# Patient Record
Sex: Female | Born: 1965 | Race: White | Hispanic: No | Smoking: Former smoker
Health system: Southern US, Community
[De-identification: ages and names within clinical notes are randomized; demographics above are authoritative.]

## PROBLEM LIST (undated history)

## (undated) DIAGNOSIS — S82891A Other fracture of right lower leg, initial encounter for closed fracture: Secondary | ICD-10-CM

## (undated) DIAGNOSIS — F4310 Post-traumatic stress disorder, unspecified: Secondary | ICD-10-CM

## (undated) DIAGNOSIS — Z972 Presence of dental prosthetic device (complete) (partial): Secondary | ICD-10-CM

## (undated) DIAGNOSIS — R7303 Prediabetes: Secondary | ICD-10-CM

## (undated) DIAGNOSIS — E039 Hypothyroidism, unspecified: Secondary | ICD-10-CM

## (undated) DIAGNOSIS — S0990XA Unspecified injury of head, initial encounter: Secondary | ICD-10-CM

## (undated) DIAGNOSIS — F32A Depression, unspecified: Secondary | ICD-10-CM

## (undated) DIAGNOSIS — R12 Heartburn: Secondary | ICD-10-CM

## (undated) DIAGNOSIS — M51369 Other intervertebral disc degeneration, lumbar region without mention of lumbar back pain or lower extremity pain: Secondary | ICD-10-CM

## (undated) DIAGNOSIS — G629 Polyneuropathy, unspecified: Secondary | ICD-10-CM

## (undated) DIAGNOSIS — M545 Low back pain, unspecified: Secondary | ICD-10-CM

## (undated) DIAGNOSIS — G8929 Other chronic pain: Secondary | ICD-10-CM

## (undated) DIAGNOSIS — Z87442 Personal history of urinary calculi: Secondary | ICD-10-CM

## (undated) DIAGNOSIS — Z9289 Personal history of other medical treatment: Secondary | ICD-10-CM

## (undated) DIAGNOSIS — F419 Anxiety disorder, unspecified: Secondary | ICD-10-CM

## (undated) DIAGNOSIS — M5136 Other intervertebral disc degeneration, lumbar region: Secondary | ICD-10-CM

## (undated) DIAGNOSIS — M1990 Unspecified osteoarthritis, unspecified site: Secondary | ICD-10-CM

## (undated) DIAGNOSIS — R519 Headache, unspecified: Secondary | ICD-10-CM

## (undated) DIAGNOSIS — F329 Major depressive disorder, single episode, unspecified: Secondary | ICD-10-CM

## (undated) HISTORY — PX: TONSILLECTOMY: SUR1361

## (undated) HISTORY — PX: FRACTURE SURGERY: SHX138

## (undated) HISTORY — PX: BILATERAL CARPAL TUNNEL RELEASE: SHX6508

## (undated) HISTORY — PX: MULTIPLE TOOTH EXTRACTIONS: SHX2053

## (undated) HISTORY — PX: DILATION AND CURETTAGE OF UTERUS: SHX78

---

## 1999-06-05 HISTORY — PX: ABDOMINAL HYSTERECTOMY: SHX81

## 2017-05-04 DIAGNOSIS — Z9289 Personal history of other medical treatment: Secondary | ICD-10-CM

## 2017-05-04 HISTORY — DX: Personal history of other medical treatment: Z92.89

## 2018-05-30 ENCOUNTER — Encounter (HOSPITAL_COMMUNITY): Payer: Self-pay

## 2018-05-30 ENCOUNTER — Inpatient Hospital Stay (HOSPITAL_COMMUNITY)
Admission: EM | Admit: 2018-05-30 | Discharge: 2018-06-12 | DRG: 492 | Disposition: A | Discharge status: Home Health | Payer: Medicaid - Out of State | Attending: Student | Admitting: Student

## 2018-05-30 ENCOUNTER — Emergency Department (HOSPITAL_COMMUNITY): Payer: Medicaid - Out of State

## 2018-05-30 DIAGNOSIS — Z79899 Other long term (current) drug therapy: Secondary | ICD-10-CM

## 2018-05-30 DIAGNOSIS — S82202Q Unspecified fracture of shaft of left tibia, subsequent encounter for open fracture type I or II with malunion: Secondary | ICD-10-CM

## 2018-05-30 DIAGNOSIS — S82201A Unspecified fracture of shaft of right tibia, initial encounter for closed fracture: Secondary | ICD-10-CM

## 2018-05-30 DIAGNOSIS — T148XXA Other injury of unspecified body region, initial encounter: Secondary | ICD-10-CM

## 2018-05-30 DIAGNOSIS — M47812 Spondylosis without myelopathy or radiculopathy, cervical region: Secondary | ICD-10-CM | POA: Diagnosis present

## 2018-05-30 DIAGNOSIS — S82251A Displaced comminuted fracture of shaft of right tibia, initial encounter for closed fracture: Secondary | ICD-10-CM | POA: Diagnosis present

## 2018-05-30 DIAGNOSIS — S82871B Displaced pilon fracture of right tibia, initial encounter for open fracture type I or II: Principal | ICD-10-CM | POA: Diagnosis present

## 2018-05-30 DIAGNOSIS — F419 Anxiety disorder, unspecified: Secondary | ICD-10-CM | POA: Diagnosis present

## 2018-05-30 DIAGNOSIS — T1490XA Injury, unspecified, initial encounter: Secondary | ICD-10-CM

## 2018-05-30 DIAGNOSIS — M5030 Other cervical disc degeneration, unspecified cervical region: Secondary | ICD-10-CM | POA: Diagnosis present

## 2018-05-30 DIAGNOSIS — S32031A Stable burst fracture of third lumbar vertebra, initial encounter for closed fracture: Secondary | ICD-10-CM | POA: Diagnosis present

## 2018-05-30 DIAGNOSIS — S2249XA Multiple fractures of ribs, unspecified side, initial encounter for closed fracture: Secondary | ICD-10-CM

## 2018-05-30 DIAGNOSIS — M5136 Other intervertebral disc degeneration, lumbar region: Secondary | ICD-10-CM | POA: Diagnosis present

## 2018-05-30 DIAGNOSIS — S32030A Wedge compression fracture of third lumbar vertebra, initial encounter for closed fracture: Secondary | ICD-10-CM

## 2018-05-30 DIAGNOSIS — D62 Acute posthemorrhagic anemia: Secondary | ICD-10-CM | POA: Diagnosis not present

## 2018-05-30 DIAGNOSIS — F17210 Nicotine dependence, cigarettes, uncomplicated: Secondary | ICD-10-CM | POA: Diagnosis present

## 2018-05-30 DIAGNOSIS — S82253C Displaced comminuted fracture of shaft of unspecified tibia, initial encounter for open fracture type IIIA, IIIB, or IIIC: Secondary | ICD-10-CM | POA: Diagnosis present

## 2018-05-30 DIAGNOSIS — S82202B Unspecified fracture of shaft of left tibia, initial encounter for open fracture type I or II: Secondary | ICD-10-CM

## 2018-05-30 DIAGNOSIS — S82402B Unspecified fracture of shaft of left fibula, initial encounter for open fracture type I or II: Secondary | ICD-10-CM | POA: Diagnosis present

## 2018-05-30 DIAGNOSIS — S82262B Displaced segmental fracture of shaft of left tibia, initial encounter for open fracture type I or II: Secondary | ICD-10-CM | POA: Diagnosis present

## 2018-05-30 DIAGNOSIS — Z9889 Other specified postprocedural states: Secondary | ICD-10-CM

## 2018-05-30 DIAGNOSIS — S82402Q Unspecified fracture of shaft of left fibula, subsequent encounter for open fracture type I or II with malunion: Secondary | ICD-10-CM

## 2018-05-30 DIAGNOSIS — Z419 Encounter for procedure for purposes other than remedying health state, unspecified: Secondary | ICD-10-CM

## 2018-05-30 DIAGNOSIS — S61214A Laceration without foreign body of right ring finger without damage to nail, initial encounter: Secondary | ICD-10-CM | POA: Diagnosis present

## 2018-05-30 DIAGNOSIS — S2243XA Multiple fractures of ribs, bilateral, initial encounter for closed fracture: Secondary | ICD-10-CM

## 2018-05-30 DIAGNOSIS — Y92410 Unspecified street and highway as the place of occurrence of the external cause: Secondary | ICD-10-CM

## 2018-05-30 DIAGNOSIS — F329 Major depressive disorder, single episode, unspecified: Secondary | ICD-10-CM | POA: Diagnosis present

## 2018-05-30 DIAGNOSIS — G8929 Other chronic pain: Secondary | ICD-10-CM | POA: Diagnosis present

## 2018-05-30 DIAGNOSIS — S2220XA Unspecified fracture of sternum, initial encounter for closed fracture: Secondary | ICD-10-CM

## 2018-05-30 DIAGNOSIS — S82871C Displaced pilon fracture of right tibia, initial encounter for open fracture type IIIA, IIIB, or IIIC: Secondary | ICD-10-CM

## 2018-05-30 DIAGNOSIS — S32039A Unspecified fracture of third lumbar vertebra, initial encounter for closed fracture: Secondary | ICD-10-CM

## 2018-05-30 DIAGNOSIS — S82401A Unspecified fracture of shaft of right fibula, initial encounter for closed fracture: Secondary | ICD-10-CM | POA: Diagnosis present

## 2018-05-30 DIAGNOSIS — S82872C Displaced pilon fracture of left tibia, initial encounter for open fracture type IIIA, IIIB, or IIIC: Secondary | ICD-10-CM | POA: Diagnosis present

## 2018-05-30 HISTORY — DX: Personal history of other medical treatment: Z92.89

## 2018-05-30 HISTORY — DX: Personal history of urinary calculi: Z87.442

## 2018-05-30 HISTORY — DX: Major depressive disorder, single episode, unspecified: F32.9

## 2018-05-30 HISTORY — DX: Low back pain, unspecified: M54.50

## 2018-05-30 HISTORY — DX: Other chronic pain: G89.29

## 2018-05-30 HISTORY — DX: Depression, unspecified: F32.A

## 2018-05-30 HISTORY — DX: Anxiety disorder, unspecified: F41.9

## 2018-05-30 HISTORY — DX: Unspecified osteoarthritis, unspecified site: M19.90

## 2018-05-30 HISTORY — DX: Other intervertebral disc degeneration, lumbar region without mention of lumbar back pain or lower extremity pain: M51.369

## 2018-05-30 HISTORY — DX: Other intervertebral disc degeneration, lumbar region: M51.36

## 2018-05-30 HISTORY — DX: Low back pain: M54.5

## 2018-05-30 LAB — PROTIME-INR
INR: 1.07
Prothrombin Time: 13.8 seconds (ref 11.4–15.2)

## 2018-05-30 LAB — I-STAT CHEM 8, ED
BUN: 9 mg/dL (ref 6–20)
Calcium, Ion: 1.17 mmol/L (ref 1.15–1.40)
Chloride: 104 mmol/L (ref 98–111)
Creatinine, Ser: 0.7 mg/dL (ref 0.44–1.00)
Glucose, Bld: 133 mg/dL — ABNORMAL HIGH (ref 70–99)
HCT: 40 % (ref 36.0–46.0)
Hemoglobin: 13.6 g/dL (ref 12.0–15.0)
Potassium: 3.5 mmol/L (ref 3.5–5.1)
Sodium: 137 mmol/L (ref 135–145)
TCO2: 23 mmol/L (ref 22–32)

## 2018-05-30 LAB — CBC
HCT: 39.5 % (ref 36.0–46.0)
Hemoglobin: 12.9 g/dL (ref 12.0–15.0)
MCH: 31.9 pg (ref 26.0–34.0)
MCHC: 32.7 g/dL (ref 30.0–36.0)
MCV: 97.8 fL (ref 80.0–100.0)
PLATELETS: 258 10*3/uL (ref 150–400)
RBC: 4.04 MIL/uL (ref 3.87–5.11)
RDW: 11.9 % (ref 11.5–15.5)
WBC: 17.7 10*3/uL — ABNORMAL HIGH (ref 4.0–10.5)
nRBC: 0 % (ref 0.0–0.2)

## 2018-05-30 LAB — I-STAT CG4 LACTIC ACID, ED: Lactic Acid, Venous: 5.92 mmol/L (ref 0.5–1.9)

## 2018-05-30 LAB — SAMPLE TO BLOOD BANK

## 2018-05-30 MED ORDER — FENTANYL CITRATE (PF) 100 MCG/2ML IJ SOLN
INTRAMUSCULAR | Status: AC
  Filled 2018-05-30: qty 2

## 2018-05-30 MED ORDER — IOHEXOL 300 MG/ML  SOLN
100.0000 mL | Freq: Once | INTRAMUSCULAR | Status: AC | PRN
  Administered 2018-05-30: 100 mL via INTRAVENOUS

## 2018-05-30 MED ORDER — FENTANYL CITRATE (PF) 100 MCG/2ML IJ SOLN
100.0000 ug | Freq: Once | INTRAMUSCULAR | Status: AC
  Administered 2018-05-30: 100 ug via INTRAVENOUS

## 2018-05-30 MED ORDER — FENTANYL CITRATE (PF) 100 MCG/2ML IJ SOLN
100.0000 ug | Freq: Once | INTRAMUSCULAR | Status: AC | PRN
  Administered 2018-05-31: 100 ug via INTRAVENOUS
  Filled 2018-05-30: qty 2

## 2018-05-30 MED ORDER — TETANUS-DIPHTH-ACELL PERTUSSIS 5-2.5-18.5 LF-MCG/0.5 IM SUSP
0.5000 mL | Freq: Once | INTRAMUSCULAR | Status: AC
  Administered 2018-05-30: 0.5 mL via INTRAMUSCULAR

## 2018-05-30 MED ORDER — CEFAZOLIN SODIUM-DEXTROSE 2-4 GM/100ML-% IV SOLN
INTRAVENOUS | Status: AC
  Administered 2018-05-30: 23:00:00
  Filled 2018-05-30: qty 100

## 2018-05-30 MED ORDER — TETANUS-DIPHTH-ACELL PERTUSSIS 5-2.5-18.5 LF-MCG/0.5 IM SUSP
INTRAMUSCULAR | Status: AC
  Filled 2018-05-30: qty 0.5

## 2018-05-30 MED ORDER — CEFAZOLIN SODIUM-DEXTROSE 1-4 GM/50ML-% IV SOLN
1.0000 g | Freq: Once | INTRAVENOUS | Status: AC
  Administered 2018-05-30: 1 g via INTRAVENOUS

## 2018-05-30 NOTE — Progress Notes (Signed)
Patient was actively being treated by medical staff  and not available for chaplain to provide care at the time.  Not aware of any family or anyone to call. Alixandria Friedt S Rhylee Pucillo, Chaplain   05/30/18 2300  Clinical Encounter Type  Visited With Patient not available   
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
## 2018-05-31 ENCOUNTER — Inpatient Hospital Stay (HOSPITAL_COMMUNITY): Payer: Medicaid - Out of State

## 2018-05-31 ENCOUNTER — Emergency Department (HOSPITAL_COMMUNITY): Payer: Medicaid - Out of State | Admitting: Certified Registered"

## 2018-05-31 ENCOUNTER — Encounter (HOSPITAL_COMMUNITY): Admission: EM | Disposition: A | Discharge status: Home Health | Payer: Self-pay | Source: Home / Self Care

## 2018-05-31 ENCOUNTER — Emergency Department (HOSPITAL_COMMUNITY): Payer: Medicaid - Out of State

## 2018-05-31 DIAGNOSIS — Y92410 Unspecified street and highway as the place of occurrence of the external cause: Secondary | ICD-10-CM | POA: Diagnosis not present

## 2018-05-31 DIAGNOSIS — Z79899 Other long term (current) drug therapy: Secondary | ICD-10-CM | POA: Diagnosis not present

## 2018-05-31 DIAGNOSIS — M5030 Other cervical disc degeneration, unspecified cervical region: Secondary | ICD-10-CM | POA: Diagnosis present

## 2018-05-31 DIAGNOSIS — S2220XA Unspecified fracture of sternum, initial encounter for closed fracture: Secondary | ICD-10-CM | POA: Diagnosis not present

## 2018-05-31 DIAGNOSIS — S82872C Displaced pilon fracture of left tibia, initial encounter for open fracture type IIIA, IIIB, or IIIC: Secondary | ICD-10-CM | POA: Diagnosis not present

## 2018-05-31 DIAGNOSIS — F17210 Nicotine dependence, cigarettes, uncomplicated: Secondary | ICD-10-CM | POA: Diagnosis present

## 2018-05-31 DIAGNOSIS — M5136 Other intervertebral disc degeneration, lumbar region: Secondary | ICD-10-CM | POA: Diagnosis present

## 2018-05-31 DIAGNOSIS — F419 Anxiety disorder, unspecified: Secondary | ICD-10-CM | POA: Diagnosis present

## 2018-05-31 DIAGNOSIS — G8929 Other chronic pain: Secondary | ICD-10-CM | POA: Diagnosis present

## 2018-05-31 DIAGNOSIS — S82871B Displaced pilon fracture of right tibia, initial encounter for open fracture type I or II: Secondary | ICD-10-CM | POA: Diagnosis not present

## 2018-05-31 DIAGNOSIS — S82401A Unspecified fracture of shaft of right fibula, initial encounter for closed fracture: Secondary | ICD-10-CM | POA: Diagnosis not present

## 2018-05-31 DIAGNOSIS — D62 Acute posthemorrhagic anemia: Secondary | ICD-10-CM | POA: Diagnosis not present

## 2018-05-31 DIAGNOSIS — S82402B Unspecified fracture of shaft of left fibula, initial encounter for open fracture type I or II: Secondary | ICD-10-CM | POA: Diagnosis not present

## 2018-05-31 DIAGNOSIS — F329 Major depressive disorder, single episode, unspecified: Secondary | ICD-10-CM | POA: Diagnosis not present

## 2018-05-31 DIAGNOSIS — T1490XA Injury, unspecified, initial encounter: Secondary | ICD-10-CM | POA: Diagnosis present

## 2018-05-31 DIAGNOSIS — M47812 Spondylosis without myelopathy or radiculopathy, cervical region: Secondary | ICD-10-CM | POA: Diagnosis present

## 2018-05-31 DIAGNOSIS — S2243XA Multiple fractures of ribs, bilateral, initial encounter for closed fracture: Secondary | ICD-10-CM | POA: Diagnosis not present

## 2018-05-31 DIAGNOSIS — S82262B Displaced segmental fracture of shaft of left tibia, initial encounter for open fracture type I or II: Secondary | ICD-10-CM | POA: Diagnosis not present

## 2018-05-31 DIAGNOSIS — S82253C Displaced comminuted fracture of shaft of unspecified tibia, initial encounter for open fracture type IIIA, IIIB, or IIIC: Secondary | ICD-10-CM | POA: Diagnosis present

## 2018-05-31 DIAGNOSIS — S61214A Laceration without foreign body of right ring finger without damage to nail, initial encounter: Secondary | ICD-10-CM | POA: Diagnosis not present

## 2018-05-31 DIAGNOSIS — S32031A Stable burst fracture of third lumbar vertebra, initial encounter for closed fracture: Secondary | ICD-10-CM | POA: Diagnosis not present

## 2018-05-31 DIAGNOSIS — S82251A Displaced comminuted fracture of shaft of right tibia, initial encounter for closed fracture: Secondary | ICD-10-CM | POA: Diagnosis not present

## 2018-05-31 HISTORY — PX: INCISION AND DRAINAGE OF WOUND: SHX1803

## 2018-05-31 HISTORY — PX: LACERATION REPAIR: SHX5168

## 2018-05-31 HISTORY — PX: I & D EXTREMITY: SHX5045

## 2018-05-31 HISTORY — PX: EXTERNAL FIXATION LEG: SHX1549

## 2018-05-31 LAB — COMPREHENSIVE METABOLIC PANEL
ALBUMIN: 3.7 g/dL (ref 3.5–5.0)
ALT: 81 U/L — ABNORMAL HIGH (ref 0–44)
ALT: 68 U/L — AB (ref 0–44)
AST: 132 U/L — ABNORMAL HIGH (ref 15–41)
AST: 124 U/L — ABNORMAL HIGH (ref 15–41)
Albumin: 3.6 g/dL (ref 3.5–5.0)
Alkaline Phosphatase: 70 U/L (ref 38–126)
Alkaline Phosphatase: 60 U/L (ref 38–126)
Anion gap: 9 (ref 5–15)
Anion gap: 10 (ref 5–15)
BUN: 8 mg/dL (ref 6–20)
BUN: 7 mg/dL (ref 6–20)
CO2: 24 mmol/L (ref 22–32)
CO2: 25 mmol/L (ref 22–32)
CREATININE: 0.76 mg/dL (ref 0.44–1.00)
Calcium: 9.1 mg/dL (ref 8.9–10.3)
Calcium: 8.4 mg/dL — ABNORMAL LOW (ref 8.9–10.3)
Chloride: 104 mmol/L (ref 98–111)
Chloride: 102 mmol/L (ref 98–111)
Creatinine, Ser: 0.91 mg/dL (ref 0.44–1.00)
GFR calc Af Amer: >60 mL/min (ref >60)
GFR calc Af Amer: >60 mL/min (ref >60)
GFR calc non Af Amer: >60 mL/min (ref >60)
Glucose, Bld: 157 mg/dL — ABNORMAL HIGH (ref 70–99)
Glucose, Bld: 178 mg/dL — ABNORMAL HIGH (ref 70–99)
Potassium: 2.8 mmol/L — ABNORMAL LOW (ref 3.5–5.1)
Potassium: 3.1 mmol/L — ABNORMAL LOW (ref 3.5–5.1)
Sodium: 137 mmol/L (ref 135–145)
Sodium: 137 mmol/L (ref 135–145)
Total Bilirubin: 0.5 mg/dL (ref 0.3–1.2)
Total Bilirubin: 0.5 mg/dL (ref 0.3–1.2)
Total Protein: 6.3 g/dL — ABNORMAL LOW (ref 6.5–8.1)
Total Protein: 5.4 g/dL — ABNORMAL LOW (ref 6.5–8.1)

## 2018-05-31 LAB — CBC
HCT: 29.5 % — ABNORMAL LOW (ref 36.0–46.0)
Hemoglobin: 9.7 g/dL — ABNORMAL LOW (ref 12.0–15.0)
MCH: 32 pg (ref 26.0–34.0)
MCHC: 32.9 g/dL (ref 30.0–36.0)
MCV: 97.4 fL (ref 80.0–100.0)
NRBC: 0 % (ref 0.0–0.2)
PLATELETS: 160 10*3/uL (ref 150–400)
RBC: 3.03 MIL/uL — AB (ref 3.87–5.11)
RDW: 11.9 % (ref 11.5–15.5)
WBC: 9.6 10*3/uL (ref 4.0–10.5)

## 2018-05-31 LAB — MAGNESIUM: Magnesium: 1.7 mg/dL (ref 1.7–2.4)

## 2018-05-31 LAB — MRSA PCR SCREENING: MRSA by PCR: NEGATIVE

## 2018-05-31 LAB — ETHANOL: Alcohol, Ethyl (B): <10 mg/dL (ref <10)

## 2018-05-31 LAB — HIV ANTIBODY (ROUTINE TESTING W REFLEX): HIV Screen 4th Generation wRfx: NONREACTIVE

## 2018-05-31 LAB — LACTIC ACID, PLASMA: Lactic Acid, Venous: 1.8 mmol/L (ref 0.5–1.9)

## 2018-05-31 SURGERY — REPAIR, LACERATION, PEDIATRIC
Anesthesia: General | Site: Leg Lower | Laterality: Right

## 2018-05-31 MED ORDER — EPHEDRINE 5 MG/ML INJ
INTRAVENOUS | Status: AC
  Filled 2018-05-31: qty 10

## 2018-05-31 MED ORDER — LACTATED RINGERS IV SOLN
INTRAVENOUS | Status: DC | PRN
  Administered 2018-05-31 (×3): via INTRAVENOUS

## 2018-05-31 MED ORDER — PROMETHAZINE HCL 25 MG/ML IJ SOLN: 6.2500 mg | INTRAMUSCULAR | Status: DC | PRN

## 2018-05-31 MED ORDER — SODIUM CHLORIDE 0.9 % IR SOLN
Status: DC | PRN
  Administered 2018-05-31: 9000 mL

## 2018-05-31 MED ORDER — ONDANSETRON HCL 4 MG/2ML IJ SOLN
INTRAMUSCULAR | Status: DC | PRN
  Administered 2018-05-31: 4 mg via INTRAVENOUS

## 2018-05-31 MED ORDER — POTASSIUM CHLORIDE IN NACL 20-0.9 MEQ/L-% IV SOLN
INTRAVENOUS | Status: DC
  Administered 2018-05-31 – 2018-06-04 (×6): via INTRAVENOUS
  Filled 2018-05-31 (×6): qty 1000

## 2018-05-31 MED ORDER — HYDROMORPHONE HCL 1 MG/ML IJ SOLN
1.0000 mg | INTRAMUSCULAR | Status: DC | PRN
  Administered 2018-05-31 – 2018-06-08 (×50): 1 mg via INTRAVENOUS
  Filled 2018-05-31 (×46): qty 1

## 2018-05-31 MED ORDER — EPHEDRINE SULFATE 50 MG/ML IJ SOLN
INTRAMUSCULAR | Status: DC | PRN
  Administered 2018-05-31: 10 mg via INTRAVENOUS

## 2018-05-31 MED ORDER — SUCCINYLCHOLINE CHLORIDE 200 MG/10ML IV SOSY
PREFILLED_SYRINGE | INTRAVENOUS | Status: AC
  Filled 2018-05-31: qty 10

## 2018-05-31 MED ORDER — LIDOCAINE 2% (20 MG/ML) 5 ML SYRINGE
INTRAMUSCULAR | Status: AC
  Filled 2018-05-31: qty 5

## 2018-05-31 MED ORDER — ROCURONIUM BROMIDE 50 MG/5ML IV SOSY
PREFILLED_SYRINGE | INTRAVENOUS | Status: AC
  Filled 2018-05-31: qty 5

## 2018-05-31 MED ORDER — FENTANYL CITRATE (PF) 250 MCG/5ML IJ SOLN
INTRAMUSCULAR | Status: AC
  Filled 2018-05-31: qty 5

## 2018-05-31 MED ORDER — HYDROMORPHONE HCL 1 MG/ML IJ SOLN: 0.5000 mg | INTRAMUSCULAR | Status: DC | PRN

## 2018-05-31 MED ORDER — ROCURONIUM BROMIDE 50 MG/5ML IV SOSY
PREFILLED_SYRINGE | INTRAVENOUS | Status: DC | PRN
  Administered 2018-05-31: 50 mg via INTRAVENOUS

## 2018-05-31 MED ORDER — ONDANSETRON HCL 4 MG/2ML IJ SOLN
INTRAMUSCULAR | Status: AC
  Filled 2018-05-31: qty 2

## 2018-05-31 MED ORDER — MIDAZOLAM HCL 2 MG/2ML IJ SOLN
INTRAMUSCULAR | Status: AC
  Filled 2018-05-31: qty 2

## 2018-05-31 MED ORDER — HYDROMORPHONE HCL 1 MG/ML IJ SOLN: 0.2500 mg | INTRAMUSCULAR | Status: DC | PRN

## 2018-05-31 MED ORDER — DOCUSATE SODIUM 100 MG PO CAPS
100.0000 mg | ORAL_CAPSULE | Freq: Two times a day (BID) | ORAL | Status: DC
  Administered 2018-05-31 – 2018-06-12 (×21): 100 mg via ORAL
  Filled 2018-05-31 (×21): qty 1

## 2018-05-31 MED ORDER — ONDANSETRON HCL 4 MG/2ML IJ SOLN
4.0000 mg | Freq: Four times a day (QID) | INTRAMUSCULAR | Status: DC | PRN
  Administered 2018-06-03 – 2018-06-04 (×2): 4 mg via INTRAVENOUS
  Filled 2018-05-31 (×2): qty 2

## 2018-05-31 MED ORDER — CEFAZOLIN SODIUM-DEXTROSE 1-4 GM/50ML-% IV SOLN
INTRAVENOUS | Status: DC | PRN
  Administered 2018-05-31: 1 g via INTRAVENOUS

## 2018-05-31 MED ORDER — ALBUMIN HUMAN 5 % IV SOLN
INTRAVENOUS | Status: DC | PRN
  Administered 2018-05-31: 01:00:00 via INTRAVENOUS

## 2018-05-31 MED ORDER — SUGAMMADEX SODIUM 200 MG/2ML IV SOLN
INTRAVENOUS | Status: DC | PRN
  Administered 2018-05-31: 200 mg via INTRAVENOUS

## 2018-05-31 MED ORDER — PHENYLEPHRINE HCL 10 MG/ML IJ SOLN
INTRAMUSCULAR | Status: DC | PRN
  Administered 2018-05-31: 40 ug via INTRAVENOUS
  Administered 2018-05-31 (×2): 80 ug via INTRAVENOUS
  Administered 2018-05-31: 120 ug via INTRAVENOUS
  Administered 2018-05-31 (×2): 80 ug via INTRAVENOUS
  Administered 2018-05-31: 120 ug via INTRAVENOUS

## 2018-05-31 MED ORDER — VARENICLINE TARTRATE 0.5 MG PO TABS
0.5000 mg | ORAL_TABLET | Freq: Every day | ORAL | Status: AC
  Administered 2018-05-31 – 2018-06-01 (×2): 0.5 mg via ORAL
  Filled 2018-05-31 (×3): qty 1

## 2018-05-31 MED ORDER — PHENYLEPHRINE 40 MCG/ML (10ML) SYRINGE FOR IV PUSH (FOR BLOOD PRESSURE SUPPORT)
PREFILLED_SYRINGE | INTRAVENOUS | Status: AC
  Filled 2018-05-31: qty 20

## 2018-05-31 MED ORDER — VARENICLINE TARTRATE 1 MG PO TABS
1.0000 mg | ORAL_TABLET | Freq: Two times a day (BID) | ORAL | Status: DC
  Administered 2018-06-07 – 2018-06-11 (×9): 1 mg via ORAL
  Filled 2018-05-31 (×13): qty 1

## 2018-05-31 MED ORDER — ENOXAPARIN SODIUM 40 MG/0.4ML ~~LOC~~ SOLN
40.0000 mg | SUBCUTANEOUS | Status: DC
  Administered 2018-05-31 – 2018-06-04 (×5): 40 mg via SUBCUTANEOUS
  Filled 2018-05-31 (×5): qty 0.4

## 2018-05-31 MED ORDER — VARENICLINE TARTRATE 0.5 MG PO TABS
0.5000 mg | ORAL_TABLET | Freq: Two times a day (BID) | ORAL | Status: AC
  Administered 2018-06-03 – 2018-06-06 (×7): 0.5 mg via ORAL
  Filled 2018-05-31 (×8): qty 1

## 2018-05-31 MED ORDER — FENTANYL CITRATE (PF) 100 MCG/2ML IJ SOLN
INTRAMUSCULAR | Status: DC | PRN
  Administered 2018-05-31 (×2): 50 ug via INTRAVENOUS

## 2018-05-31 MED ORDER — LIDOCAINE HCL (CARDIAC) PF 100 MG/5ML IV SOSY
PREFILLED_SYRINGE | INTRAVENOUS | Status: DC | PRN
  Administered 2018-05-31: 100 mg via INTRAVENOUS

## 2018-05-31 MED ORDER — HYDROMORPHONE HCL 1 MG/ML IJ SOLN
1.0000 mg | INTRAMUSCULAR | Status: DC | PRN
  Administered 2018-06-03: 1 mg via INTRAVENOUS
  Filled 2018-05-31 (×5): qty 1

## 2018-05-31 MED ORDER — CEFAZOLIN SODIUM-DEXTROSE 1-4 GM/50ML-% IV SOLN
1.0000 g | Freq: Three times a day (TID) | INTRAVENOUS | Status: DC
  Administered 2018-05-31 – 2018-06-01 (×4): 1 g via INTRAVENOUS
  Filled 2018-05-31 (×5): qty 50

## 2018-05-31 MED ORDER — SUCCINYLCHOLINE CHLORIDE 20 MG/ML IJ SOLN
INTRAMUSCULAR | Status: DC | PRN
  Administered 2018-05-31: 100 mg via INTRAVENOUS

## 2018-05-31 MED ORDER — MEPERIDINE HCL 50 MG/ML IJ SOLN: 6.2500 mg | INTRAMUSCULAR | Status: DC | PRN

## 2018-05-31 MED ORDER — ONDANSETRON 4 MG PO TBDP
4.0000 mg | ORAL_TABLET | Freq: Four times a day (QID) | ORAL | Status: DC | PRN
  Administered 2018-06-02 – 2018-06-10 (×4): 4 mg via ORAL
  Filled 2018-05-31 (×4): qty 1

## 2018-05-31 MED ORDER — DEXAMETHASONE SODIUM PHOSPHATE 10 MG/ML IJ SOLN
INTRAMUSCULAR | Status: AC
  Filled 2018-05-31: qty 1

## 2018-05-31 MED ORDER — POTASSIUM CHLORIDE 10 MEQ/100ML IV SOLN: 10.0000 meq | INTRAVENOUS | Status: DC

## 2018-05-31 MED ORDER — DEXAMETHASONE SODIUM PHOSPHATE 10 MG/ML IJ SOLN
INTRAMUSCULAR | Status: DC | PRN
  Administered 2018-05-31: 10 mg via INTRAVENOUS

## 2018-05-31 MED ORDER — PROPOFOL 10 MG/ML IV BOLUS
INTRAVENOUS | Status: DC | PRN
  Administered 2018-05-31: 100 mg via INTRAVENOUS

## 2018-05-31 MED ORDER — PROPOFOL 10 MG/ML IV BOLUS
INTRAVENOUS | Status: AC
  Filled 2018-05-31: qty 20

## 2018-05-31 SURGICAL SUPPLY — 89 items
ALCOHOL 70% 16 OZ (MISCELLANEOUS) ×3 IMPLANT
BANDAGE ACE 4X5 VEL STRL LF (GAUZE/BANDAGES/DRESSINGS) ×3 IMPLANT
BANDAGE ACE 6X5 VEL STRL LF (GAUZE/BANDAGES/DRESSINGS) ×3 IMPLANT
BANDAGE ELASTIC 4 VELCRO ST LF (GAUZE/BANDAGES/DRESSINGS) ×3 IMPLANT
BANDAGE ELASTIC 6 VELCRO ST LF (GAUZE/BANDAGES/DRESSINGS) ×6 IMPLANT
BANDAGE ESMARK 6X9 LF (GAUZE/BANDAGES/DRESSINGS) ×2 IMPLANT
BAR GLASS FIBER EXFX 11X350 (EXFIX) IMPLANT
BAR GLASS FIBER EXFX 11X400 (EXFIX) ×12 IMPLANT
BNDG COHESIVE 4X5 TAN STRL (GAUZE/BANDAGES/DRESSINGS) IMPLANT
BNDG COHESIVE 6X5 TAN STRL LF (GAUZE/BANDAGES/DRESSINGS) ×3 IMPLANT
BNDG ESMARK 6X9 LF (GAUZE/BANDAGES/DRESSINGS) ×3
BNDG GAUZE ELAST 4 BULKY (GAUZE/BANDAGES/DRESSINGS) ×9 IMPLANT
CLAMP BLUE BAR TO PIN (EXFIX) ×12 IMPLANT
CLEANER TIP ELECTROSURG 2X2 (MISCELLANEOUS) ×3 IMPLANT
COVER SURGICAL LIGHT HANDLE (MISCELLANEOUS) ×3 IMPLANT
COVER WAND RF STERILE (DRAPES) ×3 IMPLANT
CUFF TOURNIQUET SINGLE 18IN (TOURNIQUET CUFF) IMPLANT
CUFF TOURNIQUET SINGLE 24IN (TOURNIQUET CUFF) IMPLANT
CUFF TOURNIQUET SINGLE 34IN LL (TOURNIQUET CUFF) IMPLANT
CUFF TOURNIQUET SINGLE 44IN (TOURNIQUET CUFF) IMPLANT
DRAPE C-ARM 42X72 X-RAY (DRAPES) ×3 IMPLANT
DRAPE C-ARMOR (DRAPES) ×6 IMPLANT
DRAPE OEC MINIVIEW 54X84 (DRAPES) ×3 IMPLANT
DRAPE U-SHAPE 47X51 STRL (DRAPES) ×6 IMPLANT
DRSG ADAPTIC 3X8 NADH LF (GAUZE/BANDAGES/DRESSINGS) ×3 IMPLANT
DRSG PAD ABDOMINAL 8X10 ST (GAUZE/BANDAGES/DRESSINGS) IMPLANT
DRSG XEROFORM 1X8 (GAUZE/BANDAGES/DRESSINGS) ×3 IMPLANT
DURAPREP 26ML APPLICATOR (WOUND CARE) IMPLANT
ELECT REM PT RETURN 9FT ADLT (ELECTROSURGICAL) ×3
ELECTRODE REM PT RTRN 9FT ADLT (ELECTROSURGICAL) ×2 IMPLANT
EVACUATOR 1/8 PVC DRAIN (DRAIN) IMPLANT
GAUZE SPONGE 4X4 12PLY STRL (GAUZE/BANDAGES/DRESSINGS) ×6 IMPLANT
GAUZE SPONGE 4X4 12PLY STRL LF (GAUZE/BANDAGES/DRESSINGS) ×9 IMPLANT
GAUZE XEROFORM 5X9 LF (GAUZE/BANDAGES/DRESSINGS) ×3 IMPLANT
GLOVE BIOGEL PI IND STRL 7.5 (GLOVE) ×2 IMPLANT
GLOVE BIOGEL PI IND STRL 8 (GLOVE) ×2 IMPLANT
GLOVE BIOGEL PI INDICATOR 7.5 (GLOVE) ×1
GLOVE BIOGEL PI INDICATOR 8 (GLOVE) ×1
GLOVE ECLIPSE 7.0 STRL STRAW (GLOVE) ×3 IMPLANT
GLOVE SURG ORTHO 8.0 STRL STRW (GLOVE) ×3 IMPLANT
GOWN STRL REUS W/ TWL LRG LVL3 (GOWN DISPOSABLE) ×6 IMPLANT
GOWN STRL REUS W/ TWL XL LVL3 (GOWN DISPOSABLE) ×2 IMPLANT
GOWN STRL REUS W/TWL LRG LVL3 (GOWN DISPOSABLE) ×3
GOWN STRL REUS W/TWL XL LVL3 (GOWN DISPOSABLE) ×1
HALF PIN 5.0X160 (EXFIX) ×12 IMPLANT
HANDPIECE INTERPULSE COAX TIP (DISPOSABLE)
IV NS IRRIG 3000ML ARTHROMATIC (IV SOLUTION) ×9 IMPLANT
KIT BASIN OR (CUSTOM PROCEDURE TRAY) ×3 IMPLANT
KIT TURNOVER KIT B (KITS) ×3 IMPLANT
MANIFOLD NEPTUNE II (INSTRUMENTS) ×3 IMPLANT
NEEDLE 22X1 1/2 (OR ONLY) (NEEDLE) IMPLANT
NS IRRIG 1000ML POUR BTL (IV SOLUTION) ×3 IMPLANT
PACK ORTHO EXTREMITY (CUSTOM PROCEDURE TRAY) ×3 IMPLANT
PAD ARMBOARD 7.5X6 YLW CONV (MISCELLANEOUS) ×6 IMPLANT
PAD CAST 4YDX4 CTTN HI CHSV (CAST SUPPLIES) ×4 IMPLANT
PADDING CAST COTTON 4X4 STRL (CAST SUPPLIES) ×2
PADDING CAST COTTON 6X4 STRL (CAST SUPPLIES) ×6 IMPLANT
PIN CAPS 6MM EXFIX (EXFIX) ×3 IMPLANT
PIN CLAMP 2BAR 75MM BLUE (EXFIX) ×6 IMPLANT
PIN TRANSFIXING 5.0 (EXFIX) ×6 IMPLANT
SET CYSTO W/LG BORE CLAMP LF (SET/KITS/TRAYS/PACK) ×6 IMPLANT
SET HNDPC FAN SPRY TIP SCT (DISPOSABLE) IMPLANT
SPLINT FIBERGLASS 4X30 (CAST SUPPLIES) ×6 IMPLANT
SPONGE LAP 18X18 X RAY DECT (DISPOSABLE) ×6 IMPLANT
SPONGE LAP 4X18 RFD (DISPOSABLE) IMPLANT
STAPLER VISISTAT 35W (STAPLE) IMPLANT
STOCKINETTE IMPERVIOUS 9X36 MD (GAUZE/BANDAGES/DRESSINGS) ×6 IMPLANT
STOCKINETTE IMPERVIOUS LG (DRAPES) ×3 IMPLANT
STRIP CLOSURE SKIN 1/2X4 (GAUZE/BANDAGES/DRESSINGS) IMPLANT
SUCTION FRAZIER HANDLE 10FR (MISCELLANEOUS)
SUCTION TUBE FRAZIER 10FR DISP (MISCELLANEOUS) IMPLANT
SUT ETHILON 2 0 FS 18 (SUTURE) ×6 IMPLANT
SUT ETHILON 3 0 PS 1 (SUTURE) IMPLANT
SUT VIC AB 0 CT1 27 (SUTURE) ×2
SUT VIC AB 0 CT1 27XBRD ANBCTR (SUTURE) ×4 IMPLANT
SUT VIC AB 2-0 CT1 27 (SUTURE) ×2
SUT VIC AB 2-0 CT1 TAPERPNT 27 (SUTURE) ×4 IMPLANT
SUT VIC AB 3-0 SH 27 (SUTURE)
SUT VIC AB 3-0 SH 27X BRD (SUTURE) IMPLANT
SWAB CULTURE ESWAB REG 1ML (MISCELLANEOUS) IMPLANT
SWAB CULTURE LIQ STUART DBL (MISCELLANEOUS) IMPLANT
SYR CONTROL 10ML LL (SYRINGE) IMPLANT
TOWEL OR 17X24 6PK STRL BLUE (TOWEL DISPOSABLE) ×6 IMPLANT
TOWEL OR 17X26 10 PK STRL BLUE (TOWEL DISPOSABLE) ×6 IMPLANT
TRAY FOLEY MTR SLVR 16FR STAT (SET/KITS/TRAYS/PACK) ×3 IMPLANT
TUBE CONNECTING 12X1/4 (SUCTIONS) ×3 IMPLANT
UNDERPAD 30X30 (UNDERPADS AND DIAPERS) ×3 IMPLANT
WATER STERILE IRR 1000ML POUR (IV SOLUTION) ×6 IMPLANT
YANKAUER SUCT BULB TIP NO VENT (SUCTIONS) ×3 IMPLANT

## 2018-05-31 NOTE — Anesthesia Procedure Notes (Signed)
Procedure Name: Intubation Date/Time: 05/31/2018 1:09 AM Performed by: Babs Bertin, CRNA Pre-anesthesia Checklist: Patient identified, Emergency Drugs available, Suction available and Patient being monitored Patient Re-evaluated:Patient Re-evaluated prior to induction Oxygen Delivery Method: Circle System Utilized Preoxygenation: Pre-oxygenation with 100% oxygen Induction Type: IV induction and Rapid sequence Laryngoscope Size: Mac and 3 Grade View: Grade I Tube type: Oral Tube size: 7.0 mm Number of attempts: 1 Airway Equipment and Method: Stylet and Oral airway Placement Confirmation: ETT inserted through vocal cords under direct vision,  positive ETCO2 and breath sounds checked- equal and bilateral Secured at: 21 cm Tube secured with: Tape Dental Injury: Teeth and Oropharynx as per pre-operative assessment
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
## 2018-05-31 NOTE — Transfer of Care (Signed)
Immediate Anesthesia Transfer of Care Note  Patient: Judy Hernandez  Procedure(s) Performed: IRRIGATION AND DEBRIDEMENT EXTREMITY (Bilateral Leg Lower) EXTERNAL FIXATION BOTH LEGS (Bilateral Leg Lower) LACERATION REPAIR RIGHT HAND (Right Hand) IRRIGATION AND DEBRIDEMENT WOUND (Right Hand)  Patient Location: PACU  Anesthesia Type:General  Level of Consciousness: awake, alert  and patient cooperative  Airway & Oxygen Therapy: Patient Spontanous Breathing and Patient connected to face mask oxygen  Post-op Assessment: Report given to RN and Post -op Vital signs reviewed and stable  Post vital signs: Reviewed and stable  Last Vitals:  Vitals Value Taken Time  BP 126/75 05/31/2018  3:44 AM  Temp    Pulse 87 05/31/2018  3:46 AM  Resp 18 05/31/2018  3:46 AM  SpO2 100 % 05/31/2018  3:46 AM  Vitals shown include unvalidated device data.  Last Pain:  Vitals:   05/30/18 2308  TempSrc: Oral  PainSc:          Complications: No apparent anesthesia complications
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
## 2018-05-31 NOTE — Progress Notes (Signed)
Patient was actively being treated by medical staff  and not available for chaplain to provide care at the time.  Not aware of any family or anyone to call. Phebe CollaDonna S Delayla Hoffmaster, Chaplain   05/30/18 2300  Clinical Encounter Type  Visited With Patient not available
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
## 2018-05-31 NOTE — Progress Notes (Signed)
Subjective: Day of Surgery Procedure(s) (LRB): IRRIGATION AND DEBRIDEMENT EXTREMITY (Bilateral) EXTERNAL FIXATION BOTH LEGS (Bilateral) LACERATION REPAIR RIGHT HAND (Right) IRRIGATION AND DEBRIDEMENT WOUND (Right) Patient complains of global pain this morning.  Pain is worse in her back.  She denies any lower extremity numbness or tingling or weakness.  She says the pain medication has been helping but makes her sleepy.  She is denies any trouble breathing.   Objective: Vital signs in last 24 hours: Temp:  [97.2 F (36.2 C)-98.3 F (36.8 C)] 97.8 F (36.6 C) (12/28 0818) Pulse Rate:  [61-90] 63 (12/28 1000) Resp:  [10-20] 13 (12/28 1000) BP: (92-146)/(61-109) 99/70 (12/28 1000) SpO2:  [91 %-100 %] 93 % (12/28 1000) Weight:  [57.6 kg-60.3 kg] 60.3 kg (12/28 0431)  Intake/Output from previous day: 12/27 0701 - 12/28 0700 In: 2870.7 [I.V.:2495; IV Piggyback:375.7] Out: 575 [Urine:475; Blood:100] Intake/Output this shift: Total I/O In: 1020 [P.O.:720; I.V.:300] Out: -   Recent Labs    05/30/18 2254 05/30/18 2328 05/31/18 0501  HGB 12.9 13.6 9.7*   Recent Labs    05/30/18 2254 05/30/18 2328 05/31/18 0501  WBC 17.7*  --  9.6  RBC 4.04  --  3.03*  HCT 39.5 40.0 29.5*  PLT 258  --  160   Patient is awake and alert.  She is in mild distress. Respirations even and unlabored.   She is in a cervical collar.  Orthopedic exam Patient is able to elevate bilateral upper extremities off the bed without difficulty.  She has no deformity or crepitance with palpation of her clavicles, shoulders, elbows, forearms, wrists or hands bilaterally.  She endorses sensation in the median, ulnar, radial nerve distribution in bilateral hands.  Fingers are warm and well-perfused.  She does have a dressing in place to the right hand.  Bilateral lower extremities in delta frame external fixators.  Splints in place as well.  On the left knee she does have some medial knee ecchymosis and swelling.   This area is tender to palpation.  She has pain with attempted ligamentous exam of the left knee.  Right knee does not show any notable swelling or bruising.  Some superficial abrasions.  No tenderness palpation about the knee.  Bilateral feet wrapped in splint material toes that are exposed are warm and well-perfused.  She wiggles toes actively on command.  She endorses sensation about the toes.     Assessment/Plan: Day of Surgery Procedure(s) (LRB): IRRIGATION AND DEBRIDEMENT EXTREMITY (Bilateral) EXTERNAL FIXATION BOTH LEGS (Bilateral) LACERATION REPAIR RIGHT HAND (Right) IRRIGATION AND DEBRIDEMENT WOUND (Right)  Patient is on logroll precautions per neurosurgery recommendations given her spine fractures. From an orthopedic standpoint she is nonweightbearing on bilateral lower extremities. Lovenox for DVT prophylaxis. We will order CT scans of bilateral legs to better categorize intra-articular extension and the nature of her fracture patterns.  We will also order post reduction x-rays today. I had a lengthy discussion with her regarding need for further surgeries and the severity of her injuries.  She voiced understanding. I plan to discuss her case with the orthopedic trauma team and given the severity of her injuries will likely have them assume care and treatment of her orthopedic injuries.    Terance HartChristopher R Emmakate Hypes 05/31/2018, 11:06 AM

## 2018-05-31 NOTE — Anesthesia Preprocedure Evaluation (Signed)
Anesthesia Evaluation  Patient identified by MRN, date of birth, ID band Patient awake    Reviewed: Allergy & Precautions, NPO status , Patient's Chart, lab work & pertinent test results  Airway Mallampati: II  TM Distance: >3 FB Neck ROM: Full    Dental  (+) Edentulous Upper, Dental Advisory Given   Pulmonary Current Smoker,    Pulmonary exam normal breath sounds clear to auscultation       Cardiovascular negative cardio ROS Normal cardiovascular exam Rhythm:Regular Rate:Normal     Neuro/Psych PSYCHIATRIC DISORDERS Depression negative neurological ROS     GI/Hepatic negative GI ROS, Neg liver ROS,   Endo/Other  negative endocrine ROS  Renal/GU negative Renal ROS     Musculoskeletal negative musculoskeletal ROS (+)   Abdominal   Peds  Hematology negative hematology ROS (+)   Anesthesia Other Findings   Reproductive/Obstetrics negative OB ROS                             Anesthesia Physical Anesthesia Plan  ASA: II and emergent  Anesthesia Plan: General   Post-op Pain Management:    Induction: Intravenous  PONV Risk Score and Plan: Ondansetron, Treatment may vary due to age or medical condition and Dexamethasone  Airway Management Planned: Video Laryngoscope Planned and Oral ETT  Additional Equipment: None  Intra-op Plan:   Post-operative Plan: Extubation in OR  Informed Consent: I have reviewed the patients History and Physical, chart, labs and discussed the procedure including the risks, benefits and alternatives for the proposed anesthesia with the patient or authorized representative who has indicated his/her understanding and acceptance.   Dental advisory given  Plan Discussed with: CRNA  Anesthesia Plan Comments:         Anesthesia Quick Evaluation

## 2018-06-01 ENCOUNTER — Encounter (HOSPITAL_COMMUNITY): Payer: Self-pay | Admitting: *Deleted

## 2018-06-01 ENCOUNTER — Inpatient Hospital Stay (HOSPITAL_COMMUNITY): Payer: Medicaid - Out of State

## 2018-06-01 ENCOUNTER — Other Ambulatory Visit: Payer: Self-pay

## 2018-06-01 MED ORDER — GABAPENTIN 300 MG PO CAPS
300.0000 mg | ORAL_CAPSULE | Freq: Three times a day (TID) | ORAL | Status: DC
  Administered 2018-06-01 – 2018-06-12 (×31): 300 mg via ORAL
  Filled 2018-06-01 (×30): qty 1

## 2018-06-01 MED ORDER — LORAZEPAM 1 MG PO TABS
1.0000 mg | ORAL_TABLET | ORAL | Status: DC | PRN
  Administered 2018-06-01 – 2018-06-11 (×20): 1 mg via ORAL
  Filled 2018-06-01 (×21): qty 1

## 2018-06-01 MED ORDER — LORAZEPAM 2 MG/ML IJ SOLN
1.0000 mg | INTRAMUSCULAR | Status: DC | PRN
  Administered 2018-06-06: 1 mg via INTRAMUSCULAR
  Filled 2018-06-01: qty 1

## 2018-06-01 MED ORDER — SODIUM CHLORIDE 0.9 % IV SOLN
2.0000 g | Freq: Two times a day (BID) | INTRAVENOUS | Status: DC
  Administered 2018-06-01 – 2018-06-02 (×3): 2 g via INTRAVENOUS
  Filled 2018-06-01 (×4): qty 20

## 2018-06-01 NOTE — Progress Notes (Signed)
Orthopedic Tech Progress Note Patient Details:  Judy Hernandez [birth year] [id number]  Patient ID: Judy Hernandez, female   DOB: [birth year], 52 y.o.   MRN: [id number]   Saul FordyceJennifer C Morgan Rennert 06/01/2018, 10:21 AMCalled [employer] for TLSO brace.
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
## 2018-06-01 NOTE — Progress Notes (Signed)
Pt reminded multiple times to lay flat and log roll only. Pt continued to move around in bed. Explained to pt importance of laying flat. Managed pain with ordered dilaudid but suggested to RN potential change in pain medication in AM.
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
## 2018-06-01 NOTE — Anesthesia Postprocedure Evaluation (Signed)
Anesthesia Post Note  Patient: Judy Hernandez  Procedure(s) Performed: IRRIGATION AND DEBRIDEMENT EXTREMITY (Bilateral Leg Lower) EXTERNAL FIXATION BOTH LEGS (Bilateral Leg Lower) LACERATION REPAIR RIGHT HAND (Right Hand) IRRIGATION AND DEBRIDEMENT WOUND (Right Hand)     Patient location during evaluation: PACU Anesthesia Type: General Level of consciousness: sedated and patient cooperative Pain management: pain level controlled Vital Signs Assessment: post-procedure vital signs reviewed and stable Respiratory status: spontaneous breathing Cardiovascular status: stable Anesthetic complications: no    Last Vitals:  Vitals:   06/01/18 1600 06/01/18 1700  BP: 119/66 114/73  Pulse: 69 86  Resp: 11 16  Temp: 36.9 C   SpO2: 95% 95%    Last Pain:  Vitals:   06/01/18 1703  TempSrc:   PainSc: 9                  Lewie LoronJohn Jaimes Eckert

## 2018-06-01 NOTE — Progress Notes (Signed)
Patient ID: Judy Hernandez, female   DOB: 1966-04-10, 52 y.o.   MRN: [id number] Patient complains of back pain and leg pain.  Leg pain is related to surgery and does not sound radicular necessarily.  Wiggles toes and moves legs.  This seems stable based on previous notes.  MRI reviewed.  There is an L3 acute fracture with slight retropulsion causing mild canal narrowing but no obvious neural compression.  There is no subluxation.  CT scan also reviewed.  Attempt to mobilize in brace.  Brace ordered.  Logroll in bed.
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
## 2018-06-02 ENCOUNTER — Inpatient Hospital Stay (HOSPITAL_COMMUNITY): Payer: Medicaid - Out of State | Admitting: Certified Registered"

## 2018-06-02 ENCOUNTER — Inpatient Hospital Stay (HOSPITAL_COMMUNITY): Payer: Medicaid - Out of State

## 2018-06-02 ENCOUNTER — Encounter (HOSPITAL_COMMUNITY): Payer: Self-pay | Admitting: Orthopaedic Surgery

## 2018-06-02 ENCOUNTER — Encounter (HOSPITAL_COMMUNITY): Admission: EM | Disposition: A | Discharge status: Home Health | Payer: Self-pay | Source: Home / Self Care

## 2018-06-02 HISTORY — PX: EXTERNAL FIXATION LEG: SHX1549

## 2018-06-02 HISTORY — PX: I & D EXTREMITY: SHX5045

## 2018-06-02 LAB — BASIC METABOLIC PANEL
Anion gap: 7 (ref 5–15)
BUN: <5 mg/dL — ABNORMAL LOW (ref 6–20)
CHLORIDE: 101 mmol/L (ref 98–111)
CO2: 29 mmol/L (ref 22–32)
CREATININE: 0.63 mg/dL (ref 0.44–1.00)
Calcium: 8 mg/dL — ABNORMAL LOW (ref 8.9–10.3)
GFR calc Af Amer: >60 mL/min (ref >60)
GFR calc non Af Amer: >60 mL/min (ref >60)
Glucose, Bld: 116 mg/dL — ABNORMAL HIGH (ref 70–99)
Potassium: 3.3 mmol/L — ABNORMAL LOW (ref 3.5–5.1)
Sodium: 137 mmol/L (ref 135–145)

## 2018-06-02 LAB — CBC
HEMATOCRIT: 23.3 % — AB (ref 36.0–46.0)
Hemoglobin: 7.5 g/dL — ABNORMAL LOW (ref 12.0–15.0)
MCH: 31.8 pg (ref 26.0–34.0)
MCHC: 32.2 g/dL (ref 30.0–36.0)
MCV: 98.7 fL (ref 80.0–100.0)
Platelets: 126 10*3/uL — ABNORMAL LOW (ref 150–400)
RBC: 2.36 MIL/uL — ABNORMAL LOW (ref 3.87–5.11)
RDW: 12.5 % (ref 11.5–15.5)
WBC: 7.6 10*3/uL (ref 4.0–10.5)
nRBC: 0 % (ref 0.0–0.2)

## 2018-06-02 SURGERY — EXTERNAL FIXATION, LOWER EXTREMITY
Anesthesia: General | Site: Leg Lower | Laterality: Bilateral

## 2018-06-02 MED ORDER — TOBRAMYCIN SULFATE 1.2 G IJ SOLR
INTRAMUSCULAR | Status: AC
  Filled 2018-06-02: qty 2.4

## 2018-06-02 MED ORDER — ONDANSETRON HCL 4 MG/2ML IJ SOLN
INTRAMUSCULAR | Status: AC
  Filled 2018-06-02: qty 2

## 2018-06-02 MED ORDER — LACTATED RINGERS IV SOLN: INTRAVENOUS | Status: DC

## 2018-06-02 MED ORDER — MIDAZOLAM HCL 2 MG/2ML IJ SOLN
INTRAMUSCULAR | Status: AC
  Filled 2018-06-02: qty 2

## 2018-06-02 MED ORDER — MIDAZOLAM HCL 5 MG/5ML IJ SOLN
INTRAMUSCULAR | Status: DC | PRN
  Administered 2018-06-02: 2 mg via INTRAVENOUS

## 2018-06-02 MED ORDER — METHOCARBAMOL 500 MG PO TABS
500.0000 mg | ORAL_TABLET | Freq: Three times a day (TID) | ORAL | Status: DC
  Administered 2018-06-02 – 2018-06-08 (×18): 500 mg via ORAL
  Filled 2018-06-02 (×17): qty 1

## 2018-06-02 MED ORDER — SODIUM CHLORIDE 0.9 % IV SOLN
2.0000 g | INTRAVENOUS | Status: AC
  Administered 2018-06-03 – 2018-06-05 (×3): 2 g via INTRAVENOUS
  Filled 2018-06-02 (×3): qty 20

## 2018-06-02 MED ORDER — SODIUM CHLORIDE 0.9 % IV SOLN
INTRAVENOUS | Status: DC
  Administered 2018-06-02 (×2): via INTRAVENOUS

## 2018-06-02 MED ORDER — PROPOFOL 10 MG/ML IV BOLUS
INTRAVENOUS | Status: DC | PRN
  Administered 2018-06-02: 150 mg via INTRAVENOUS

## 2018-06-02 MED ORDER — FENTANYL CITRATE (PF) 250 MCG/5ML IJ SOLN
INTRAMUSCULAR | Status: AC
  Filled 2018-06-02: qty 5

## 2018-06-02 MED ORDER — 0.9 % SODIUM CHLORIDE (POUR BTL) OPTIME
TOPICAL | Status: DC | PRN
  Administered 2018-06-02: 1000 mL

## 2018-06-02 MED ORDER — FENTANYL CITRATE (PF) 100 MCG/2ML IJ SOLN
INTRAMUSCULAR | Status: DC | PRN
  Administered 2018-06-02: 25 ug via INTRAVENOUS
  Administered 2018-06-02 (×3): 50 ug via INTRAVENOUS
  Administered 2018-06-02: 25 ug via INTRAVENOUS
  Administered 2018-06-02: 50 ug via INTRAVENOUS

## 2018-06-02 MED ORDER — DEXAMETHASONE SODIUM PHOSPHATE 10 MG/ML IJ SOLN
INTRAMUSCULAR | Status: DC | PRN
  Administered 2018-06-02: 10 mg via INTRAVENOUS

## 2018-06-02 MED ORDER — SODIUM CHLORIDE 0.9 % IR SOLN
Status: DC | PRN
  Administered 2018-06-02: 3000 mL

## 2018-06-02 MED ORDER — PHENYLEPHRINE 40 MCG/ML (10ML) SYRINGE FOR IV PUSH (FOR BLOOD PRESSURE SUPPORT)
PREFILLED_SYRINGE | INTRAVENOUS | Status: DC | PRN
  Administered 2018-06-02 (×2): 200 ug via INTRAVENOUS

## 2018-06-02 MED ORDER — SODIUM CHLORIDE 0.9 % IV SOLN
INTRAVENOUS | Status: DC | PRN
  Administered 2018-06-02: 50 ug/min via INTRAVENOUS

## 2018-06-02 MED ORDER — PROPOFOL 10 MG/ML IV BOLUS
INTRAVENOUS | Status: AC
  Filled 2018-06-02: qty 20

## 2018-06-02 MED ORDER — ONDANSETRON HCL 4 MG/2ML IJ SOLN
INTRAMUSCULAR | Status: DC | PRN
  Administered 2018-06-02: 4 mg via INTRAVENOUS

## 2018-06-02 MED ORDER — VANCOMYCIN HCL 1000 MG IV SOLR
INTRAVENOUS | Status: AC
  Filled 2018-06-02: qty 2000

## 2018-06-02 MED ORDER — HYDROMORPHONE HCL 1 MG/ML IJ SOLN
INTRAMUSCULAR | Status: AC
  Administered 2018-06-02: 0.5 mg via INTRAVENOUS
  Filled 2018-06-02: qty 1

## 2018-06-02 MED ORDER — ACETAMINOPHEN 500 MG PO TABS
1000.0000 mg | ORAL_TABLET | Freq: Three times a day (TID) | ORAL | Status: DC
  Administered 2018-06-02 – 2018-06-12 (×27): 1000 mg via ORAL
  Filled 2018-06-02 (×27): qty 2

## 2018-06-02 MED ORDER — ONDANSETRON HCL 4 MG/2ML IJ SOLN: 4.0000 mg | Freq: Once | INTRAMUSCULAR | Status: DC | PRN

## 2018-06-02 MED ORDER — TOBRAMYCIN SULFATE 1.2 G IJ SOLR
INTRAMUSCULAR | Status: DC | PRN
  Administered 2018-06-02: 2.4 g via TOPICAL

## 2018-06-02 MED ORDER — HYDROMORPHONE HCL 1 MG/ML IJ SOLN
0.2500 mg | INTRAMUSCULAR | Status: DC | PRN
  Administered 2018-06-02 (×2): 0.5 mg via INTRAVENOUS

## 2018-06-02 MED ORDER — VANCOMYCIN HCL 1000 MG IV SOLR
INTRAVENOUS | Status: DC | PRN
  Administered 2018-06-02: 2 g via TOPICAL

## 2018-06-02 MED ORDER — MEPERIDINE HCL 50 MG/ML IJ SOLN: 6.2500 mg | INTRAMUSCULAR | Status: DC | PRN

## 2018-06-02 MED ORDER — LIDOCAINE 2% (20 MG/ML) 5 ML SYRINGE
INTRAMUSCULAR | Status: DC | PRN
  Administered 2018-06-02: 100 mg via INTRAVENOUS

## 2018-06-02 MED ORDER — DEXAMETHASONE SODIUM PHOSPHATE 10 MG/ML IJ SOLN
INTRAMUSCULAR | Status: AC
  Filled 2018-06-02: qty 1

## 2018-06-02 MED ORDER — EPHEDRINE SULFATE-NACL 50-0.9 MG/10ML-% IV SOSY
PREFILLED_SYRINGE | INTRAVENOUS | Status: DC | PRN
  Administered 2018-06-02 (×2): 10 mg via INTRAVENOUS

## 2018-06-02 SURGICAL SUPPLY — 78 items
BANDAGE ACE 4X5 VEL STRL LF (GAUZE/BANDAGES/DRESSINGS) ×3 IMPLANT
BANDAGE ACE 6X5 VEL STRL LF (GAUZE/BANDAGES/DRESSINGS) ×1 IMPLANT
BAR GLASS FIBER EXFX 11X150 (EXFIX) ×4 IMPLANT
BIT DRILL 2.5X2.75 QC CALB (BIT) ×1 IMPLANT
BIT DRILL 3.5X5.5 QC CALB (BIT) ×1 IMPLANT
BIT DRILL CANN XTRAFIX 2.4 (DRILL) IMPLANT
BLADE SURG 15 STRL LF DISP TIS (BLADE) IMPLANT
BLADE SURG 15 STRL SS (BLADE) ×1
BNDG COHESIVE 4X5 TAN STRL (GAUZE/BANDAGES/DRESSINGS) ×3 IMPLANT
BNDG GAUZE ELAST 4 BULKY (GAUZE/BANDAGES/DRESSINGS) ×4 IMPLANT
BRUSH SCRUB SURG 4.25 DISP (MISCELLANEOUS) ×4 IMPLANT
CANISTER WOUND CARE 500ML ATS (WOUND CARE) ×1 IMPLANT
CHLORAPREP W/TINT 26ML (MISCELLANEOUS) ×2 IMPLANT
CLAMP BLUE BAR TO BAR (EXFIX) ×2 IMPLANT
CLAMP BLUE BAR TO PIN (EXFIX) ×6 IMPLANT
CONNECTOR Y ATS VAC SYSTEM (MISCELLANEOUS) ×1 IMPLANT
COVER MAYO STAND STRL (DRAPES) ×2 IMPLANT
COVER SURGICAL LIGHT HANDLE (MISCELLANEOUS) ×4 IMPLANT
COVER WAND RF STERILE (DRAPES) ×2 IMPLANT
DRAPE C-ARM 42X72 X-RAY (DRAPES) IMPLANT
DRAPE C-ARMOR (DRAPES) ×3 IMPLANT
DRAPE EXTREMITY BILATERAL (DRAPES) ×1 IMPLANT
DRAPE IMP U-DRAPE 54X76 (DRAPES) ×5 IMPLANT
DRAPE INCISE IOBAN 66X45 STRL (DRAPES) ×1 IMPLANT
DRAPE ORTHO SPLIT 77X108 STRL (DRAPES) ×2
DRAPE SURG 17X23 STRL (DRAPES) ×2 IMPLANT
DRAPE SURG ORHT 6 SPLT 77X108 (DRAPES) ×2 IMPLANT
DRAPE U-SHAPE 47X51 STRL (DRAPES) ×2 IMPLANT
DRILL CANN 2.4 (DRILL) ×2
DRSG ADAPTIC 3X8 NADH LF (GAUZE/BANDAGES/DRESSINGS) ×2 IMPLANT
DRSG MEPITEL 4X7.2 (GAUZE/BANDAGES/DRESSINGS) ×1 IMPLANT
DRSG VAC ATS MED SENSATRAC (GAUZE/BANDAGES/DRESSINGS) ×2 IMPLANT
ELECT REM PT RETURN 9FT ADLT (ELECTROSURGICAL) ×2
ELECTRODE REM PT RTRN 9FT ADLT (ELECTROSURGICAL) ×1 IMPLANT
EVACUATOR 1/8 PVC DRAIN (DRAIN) IMPLANT
GAUZE SPONGE 4X4 12PLY STRL (GAUZE/BANDAGES/DRESSINGS) ×2 IMPLANT
GLOVE BIO SURGEON STRL SZ 6.5 (GLOVE) ×6 IMPLANT
GLOVE BIO SURGEON STRL SZ7.5 (GLOVE) ×8 IMPLANT
GLOVE BIOGEL PI IND STRL 6.5 (GLOVE) ×1 IMPLANT
GLOVE BIOGEL PI IND STRL 7.5 (GLOVE) ×1 IMPLANT
GLOVE BIOGEL PI INDICATOR 6.5 (GLOVE) ×1
GLOVE BIOGEL PI INDICATOR 7.5 (GLOVE) ×1
GOWN STRL REUS W/ TWL LRG LVL3 (GOWN DISPOSABLE) ×2 IMPLANT
GOWN STRL REUS W/TWL LRG LVL3 (GOWN DISPOSABLE) ×2
HANDPIECE INTERPULSE COAX TIP (DISPOSABLE) ×1
KIT BASIN OR (CUSTOM PROCEDURE TRAY) ×2 IMPLANT
KIT TURNOVER KIT B (KITS) ×2 IMPLANT
MANIFOLD NEPTUNE II (INSTRUMENTS) ×2 IMPLANT
NEEDLE 22X1 1/2 (OR ONLY) (NEEDLE) IMPLANT
NS IRRIG 1000ML POUR BTL (IV SOLUTION) ×2 IMPLANT
PACK ORTHO EXTREMITY (CUSTOM PROCEDURE TRAY) ×2 IMPLANT
PAD ARMBOARD 7.5X6 YLW CONV (MISCELLANEOUS) ×4 IMPLANT
PAD CAST 4YDX4 CTTN HI CHSV (CAST SUPPLIES) IMPLANT
PADDING CAST COTTON 4X4 STRL (CAST SUPPLIES) ×2
PADDING CAST COTTON 6X4 STRL (CAST SUPPLIES) ×2 IMPLANT
PIN 4X100X20MM EXFIX LG BLUNT (PIN) ×4 IMPLANT
SCREW CORT FT 32X3.5XNONLOCK (Screw) IMPLANT
SCREW CORTICAL 3.5MM  28MM (Screw) ×1 IMPLANT
SCREW CORTICAL 3.5MM  32MM (Screw) ×2 IMPLANT
SCREW CORTICAL 3.5MM 28MM (Screw) IMPLANT
SET HNDPC FAN SPRY TIP SCT (DISPOSABLE) IMPLANT
SPONGE LAP 18X18 RF (DISPOSABLE) ×1 IMPLANT
SPONGE LAP 18X18 X RAY DECT (DISPOSABLE) ×2 IMPLANT
STAPLER VISISTAT 35W (STAPLE) ×2 IMPLANT
STRIP CLOSURE SKIN 1/2X4 (GAUZE/BANDAGES/DRESSINGS) IMPLANT
SUT ETHILON 2 0 FS 18 (SUTURE) ×4 IMPLANT
SUT ETHILON 3 0 FSL (SUTURE) ×1 IMPLANT
SUT ETHILON 3 0 PS 1 (SUTURE) ×7 IMPLANT
SUT MON AB 2-0 CT1 36 (SUTURE) ×3 IMPLANT
SUT PDS AB 0 CT 36 (SUTURE) IMPLANT
SUT PROLENE 0 CT (SUTURE) IMPLANT
SWAB CULTURE ESWAB REG 1ML (MISCELLANEOUS) IMPLANT
TOWEL OR 17X24 6PK STRL BLUE (TOWEL DISPOSABLE) ×4 IMPLANT
TOWEL OR 17X26 10 PK STRL BLUE (TOWEL DISPOSABLE) ×4 IMPLANT
TUBE CONNECTING 12X1/4 (SUCTIONS) ×2 IMPLANT
UNDERPAD 30X30 (UNDERPADS AND DIAPERS) ×2 IMPLANT
WATER STERILE IRR 1000ML POUR (IV SOLUTION) ×1 IMPLANT
YANKAUER SUCT BULB TIP NO VENT (SUCTIONS) ×3 IMPLANT

## 2018-06-02 NOTE — Anesthesia Postprocedure Evaluation (Signed)
Anesthesia Post Note  Patient: Judy Hernandez  Procedure(s) Performed: ADJUSTMENT OF EXTERNAL FIXATION BILATERAL TIBIAS (Bilateral Leg Lower) IRRIGATION AND DEBRIDEMENT BILATERAL TIBIAS (Bilateral Leg Lower)     Patient location during evaluation: PACU Anesthesia Type: General Level of consciousness: awake and alert Pain management: pain level controlled Vital Signs Assessment: post-procedure vital signs reviewed and stable Respiratory status: spontaneous breathing, nonlabored ventilation, respiratory function stable and patient connected to nasal cannula oxygen Cardiovascular status: blood pressure returned to baseline and stable Postop Assessment: no apparent nausea or vomiting Anesthetic complications: no    Last Vitals:  Vitals:   06/02/18 1410 06/02/18 1419  BP:  112/74  Pulse:  89  Resp:  (!) 25  Temp: 36.7 C   SpO2:  100%    Last Pain:  Vitals:   06/02/18 1400  TempSrc:   PainSc: 6                  [name] DAVID
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
## 2018-06-02 NOTE — Progress Notes (Signed)
RN verified the presence of a signed informed consent that matches stated procedure by patient. Verified armband matches patient's stated name and birth date. Verified NPO status and that all jewelry, contact, glasses, dentures, and partials had been removed (if applicable).  

## 2018-06-02 NOTE — Interval H&P Note (Signed)
History and Physical Interval Note:  06/02/2018 10:17 AM  Judy Hernandez  has presented today for surgery, with the diagnosis of Bilateral open tibia fractures  The various methods of treatment have been discussed with the patient and family. After consideration of risks, benefits and other options for treatment, the patient has consented to  Procedure(s): ADJUSTMENT OF EXTERNAL FIXATION BILATERAL TIBIAS (Bilateral) IRRIGATION AND DEBRIDEMENT BILATERAL TIBIAS (Bilateral) as a surgical intervention .  The patient's history has been reviewed, patient examined, no change in status, stable for surgery.  I have reviewed the patient's chart and labs.  Questions were answered to the patient's satisfaction.     Caryn BeeKevin P Veyda Kaufman

## 2018-06-02 NOTE — Transfer of Care (Signed)
Immediate Anesthesia Transfer of Care Note  Patient: Judy Hernandez  Procedure(s) Performed: ADJUSTMENT OF EXTERNAL FIXATION BILATERAL TIBIAS (Bilateral Leg Lower) IRRIGATION AND DEBRIDEMENT BILATERAL TIBIAS (Bilateral Leg Lower)  Patient Location: PACU  Anesthesia Type:General  Level of Consciousness: awake and patient cooperative  Airway & Oxygen Therapy: Patient Spontanous Breathing and Patient connected to face mask oxygen  Post-op Assessment: Report given to RN and Post -op Vital signs reviewed and stable  Post vital signs: Reviewed and stable  Last Vitals:  Vitals Value Taken Time  BP 106/70 06/02/2018  1:30 PM  Temp    Pulse 83 06/02/2018  1:31 PM  Resp 11 06/02/2018  1:31 PM  SpO2 99 % 06/02/2018  1:31 PM  Vitals shown include unvalidated device data.  Last Pain:  Vitals:   06/02/18 0434  TempSrc: Axillary  PainSc:       Patients Stated Pain Goal: 2 (05/31/18 0740)  Complications: No apparent anesthesia complications

## 2018-06-02 NOTE — Anesthesia Procedure Notes (Signed)
Procedure Name: LMA Insertion Date/Time: 06/02/2018 10:52 AM Performed by: Rosiland OzMeyers, Dona Walby, CRNA Pre-anesthesia Checklist: Patient identified, Emergency Drugs available, Suction available, Patient being monitored and Timeout performed Patient Re-evaluated:Patient Re-evaluated prior to induction Oxygen Delivery Method: Circle system utilized Preoxygenation: Pre-oxygenation with 100% oxygen Induction Type: IV induction LMA: LMA inserted LMA Size: 4.0 Number of attempts: 1 Placement Confirmation: positive ETCO2 and breath sounds checked- equal and bilateral Tube secured with: Tape Dental Injury: Teeth and Oropharynx as per pre-operative assessment

## 2018-06-02 NOTE — Progress Notes (Signed)
While repositioning patient noticed leakage from right and left legs at external fixation sites. Greater leakage on the right.  A little more swelling in the right foot than earlier. Call to trauma services that referred to ortho. Call To Dr Eulah PontMurphy with no new orders.

## 2018-06-02 NOTE — Progress Notes (Signed)
Subjective: Patient about to leave for the operating room for orthopedic washout of distal lower extremities.  Continues to complain of back pain.  MRI of lumbar spine reviewed: L3 compression/burst fracture without change, noncompressed L2 fracture demonstrated, bilateral L5 pars interarticularis defect again demonstrated.  TLSO ordered, Aspen type delivered, not yet used.  Objective: Vital signs in last 24 hours: Vitals:   06/01/18 2128 06/01/18 2129 06/02/18 0018 06/02/18 0434  BP: 96/73     Pulse:      Resp: 18     Temp:  99.4 F (37.4 C) 98.7 F (37.1 C) 98.9 F (37.2 C)  TempSrc:  Oral Oral Axillary  SpO2:      Weight:      Height:        Intake/Output from previous day: 12/29 0701 - 12/30 0700 In: 1536.5 [P.O.:932; I.V.:404.7; IV Piggyback:199.9] Out: 1900 [Urine:1900] Intake/Output this shift: No intake/output data recorded.  Physical Exam: Awake and alert, oriented.  Following commands.  Speech fluent.  Strength remains at least 3 to 4-/5 in iliopsoas and quadriceps bilaterally, wiggling toes bilaterally.  CBC Recent Labs    05/30/18 2254 05/30/18 2328 05/31/18 0501  WBC 17.7*  --  9.6  HGB 12.9 13.6 9.7*  HCT 39.5 40.0 29.5*  PLT 258  --  160   BMET Recent Labs    05/31/18 0501 06/02/18 0908  NA 137 137  K 3.1* 3.3*  CL 102 101  CO2 25 29  GLUCOSE 178* 116*  BUN 7 <5*  CREATININE 0.76 0.63  CALCIUM 8.4* 8.0*   ABG    Component Value Date/Time   TCO2 23 05/30/2018 2328    Studies/Results: Dg Tibia/fibula Left  Result Date: 05/31/2018 CLINICAL DATA:  Postop. EXAM: LEFT TIBIA AND FIBULA - 2 VIEW COMPARISON:  05/30/2018 FINDINGS: Status post placement of external fixture. Proximal screw traverses the proximal tibia and the distal component traverses the calcaneus. Comminuted fractures of the tibia and fibula are again identified. IMPRESSION: Postoperative changes as described. Electronically Signed   By: Norva Pavlov M.D.   On: 05/31/2018  14:01   Dg Ankle Complete Right  Result Date: 05/31/2018 CLINICAL DATA:  Postoperative. EXAM: RIGHT ANKLE - COMPLETE 3+ VIEW COMPARISON:  05/31/2018 FINDINGS: Status post placement of external fixator across comminuted fracture of the RIGHT ankle fracture. Distal screw traverses the calcaneus. Again noted are comminuted fractures of the distal tibia and fibula. IMPRESSION: External fixator placement. Electronically Signed   By: Norva Pavlov M.D.   On: 05/31/2018 13:55   Mr Lumbar Spine Wo Contrast  Result Date: 06/01/2018 CLINICAL DATA:  Traumatic lumbar spine fracture EXAM: MRI LUMBAR SPINE WITHOUT CONTRAST TECHNIQUE: Multiplanar, multisequence MR imaging of the lumbar spine was performed. No intravenous contrast was administered. COMPARISON:  CT the abdomen from 2 days ago FINDINGS: Segmentation:  5 lumbar type vertebral bodies Alignment:  Chronic grade 1 anterolisthesis at L5-S1 Vertebrae: Marrow edema in the mildly depressed L3 body from comminuted fracture. There is posterior bulging of bone fragments without static neural compression. No posterior element involvement or ligamentous injury is seen. Nondepressed branching fracture in the L2 body. Chronic bilateral L5 pars defects with anterolisthesis. Conus medullaris and cauda equina: Conus extends to the L1 level. Conus and cauda equina appear normal. Paraspinal and other soft tissues: Negative Disc levels: T12- L1: Unremarkable. L1-L2: Unremarkable. L2-L3: Mild posttraumatic disc distortion.  No impingement L3-L4: Minor disc bulging.  No impingement L4-L5: Disc narrowing and bulging.  No impingement L5-S1:The disc is narrowed  and bulging. There is noncompressive bilateral foraminal stenosis. Widely patent spinal canal IMPRESSION: 1. Comminuted L3 body fracture with mild height loss and noncompressive retropulsion. 2. Nondepressed L2 body fracture. 3. Negative for ligamentous or posterior element injury. 4. L5 chronic bilateral pars defects with  L5-S1 anterolisthesis and noncompressive foraminal narrowing. Electronically Signed   By: Marnee SpringJonathon  Watts M.D.   On: 06/01/2018 06:38   Ct Ankle Right Wo Contrast  Result Date: 05/31/2018 CLINICAL DATA:  Comminuted fractures of the distal right tibia and fibula. EXAM: CT OF THE RIGHT ANKLE WITHOUT CONTRAST TECHNIQUE: Multidetector CT imaging of the right ankle was performed according to the standard protocol. Multiplanar CT image reconstructions were also generated. COMPARISON:  Radiographs dated 05/31/2018 FINDINGS: Bones/Joint/Cartilage There is a severely comminuted fracture of distal right tibia with multiple displaced and rotated angulated fragments. The fracture involves the articular surface of the distal tibia with oblique coronal and sagittal fractures through the articular surface. The anterior margin of the articular surface of the distal tibia is severely comminuted. An oblique fracture extends through the base of the medial malleolus. There is also a comminuted slightly displaced fracture of the distal fibula with rotated and displaced fragments. The distal tibiofibular syndesmosis does not appear to be disrupted. There are multiple small bone fragments as well as gas in the ankle joint. The talus and calcaneus and bones of the midfoot are intact. There is a small amount of gas in the subtalar joint. There is extensive air in the soft tissues of distal right lower leg and around the ankle. The tendons around the ankle appear intact. There are some bone fragments adjacent to the posterior tibialis tendon at the level of the medial malleolus. A tibial bone fragment extends into the flexor digitorum longus muscle best seen on image 33 of series 5. Ligaments Suboptimally assessed by CT. Not well seen. IMPRESSION: 1. Severely comminuted fracture of the distal tibia as described. 2. Comminuted fracture of the distal fibula. 3. Multiple small bone fragments and gas in the ankle joint. 4. Multiple bone  fragments adjacent to the posterior tibialis tendon at the level of the medial malleolus. Electronically Signed   By: Francene BoyersJames  Maxwell M.D.   On: 05/31/2018 15:40   Ct Tibia Fibula Left Wo Contrast  Result Date: 05/31/2018 CLINICAL DATA:  Comminuted fractures of distal left tibia and of the proximal and distal aspects of the left fibula. EXAM: CT OF THE LOWER LEFT EXTREMITY WITHOUT CONTRAST TECHNIQUE: Multidetector CT imaging of the lower left extremity was performed according to the standard protocol. COMPARISON:  Radiographs dated 05/31/2018 FINDINGS: Bones/Joint/Cartilage There is a comminuted fracture of the distal 14 cm of the left tibia angulation and displacement and rotation of multiple fragments. The fracture extends into the articular surface of the distal tibia, best seen on image 234 of series 3. There is no depression of the articular surface components. There are multiple small avulsions from the anterior aspect of the lateral malleolus. There is a slightly displaced comminuted fracture of the distal fibular shaft as well as a comminuted minimally displaced fracture of the proximal fibular shaft and fibular head. There is gas in the soft tissues and within the distal tibia at the site of the tibia fracture. There is a small amount of gas in the ankle joint. There is also gas in the soft tissues of the proximal portion of the left lower leg and in the superficial soft tissues of the medial and lateral aspects of the knee. There is  a minimal knee effusion. Fixation hardware in place in proximal tibia. IMPRESSION: 1. Comminuted fracture of the distal 14 cm of the left tibia extending into the articular surface of the distal tibia. 2. Comminuted fractures of the distal fibular shaft and of the proximal fibular shaft. 3. Multiple small avulsions from the anterior aspect of the lateral malleolus. Electronically Signed   By: Francene BoyersJames  Maxwell M.D.   On: 05/31/2018 15:48   Dg Foot Complete Left  Result  Date: 05/31/2018 CLINICAL DATA:  Postoperative. EXAM: LEFT FOOT - COMPLETE 3+ VIEW COMPARISON:  05/30/2018, 05/31/2018 FINDINGS: Status post placement of external fixator with screw traversing the calcaneus. No acute fracture of the LEFT foot. Partially imaged comminuted fractures of the distal tibia and fibula. IMPRESSION: Status post placement of external fixer. Electronically Signed   By: Norva PavlovElizabeth  Brown M.D.   On: 05/31/2018 13:58   Dg Foot Complete Right  Result Date: 05/31/2018 CLINICAL DATA:  Postop. EXAM: RIGHT FOOT COMPLETE - 3+ VIEW COMPARISON:  05/30/2018 FINDINGS: Status post placement of external fixture across the calcaneus. No acute fracture of the RIGHT foot. Partially imaged comminuted fractures of the distal tibia and fibula. IMPRESSION: Status post placement of external fixator. Electronically Signed   By: Norva PavlovElizabeth  Brown M.D.   On: 05/31/2018 14:00   Dg Knee 3 Views Left  Result Date: 05/31/2018 CLINICAL DATA:  Postop. EXAM: LEFT KNEE - 3 VIEW COMPARISON:  05/30/2018 FINDINGS: Status post placement of external fixture. The proximal screw traverses the proximal tibia. Comminuted fractures of the fibula and tibia are again identified. IMPRESSION: Status post placement of external fixator. Electronically Signed   By: Norva PavlovElizabeth  Brown M.D.   On: 05/31/2018 14:01    Assessment/Plan: Neurologically without change.  Have asked nursing staff to have TLSO change from [place] to [place], so as to provide better support when we begin to mobilize her.  Discussed with she and her family that our initial approach will be nonsurgical, with bracing and progressive mobilization.  If unsuccessful, she may require surgical stabilization.  Hewitt ShortsNUDELMAN,ROBERT W, MD 06/02/2018, 9:55 AM

## 2018-06-02 NOTE — Anesthesia Preprocedure Evaluation (Signed)
Anesthesia Evaluation  Patient identified by MRN, date of birth, ID band Patient awake    Reviewed: Allergy & Precautions, NPO status , Patient's Chart, lab work & pertinent test results  Airway Mallampati: I  TM Distance: >3 FB Neck ROM: Full    Dental   Pulmonary former smoker,    Pulmonary exam normal        Cardiovascular Normal cardiovascular exam     Neuro/Psych Anxiety Depression    GI/Hepatic   Endo/Other    Renal/GU      Musculoskeletal   Abdominal   Peds  Hematology   Anesthesia Other Findings   Reproductive/Obstetrics                             Anesthesia Physical Anesthesia Plan  ASA: II  Anesthesia Plan: General   Post-op Pain Management:    Induction: Intravenous  PONV Risk Score and Plan: 3 and Ondansetron, Midazolam and Treatment may vary due to age or medical condition  Airway Management Planned: LMA  Additional Equipment:   Intra-op Plan:   Post-operative Plan: Extubation in OR  Informed Consent: I have reviewed the patients History and Physical, chart, labs and discussed the procedure including the risks, benefits and alternatives for the proposed anesthesia with the patient or authorized representative who has indicated his/her understanding and acceptance.     Plan Discussed with: CRNA and Surgeon  Anesthesia Plan Comments:         Anesthesia Quick Evaluation

## 2018-06-03 ENCOUNTER — Encounter (HOSPITAL_COMMUNITY): Payer: Self-pay | Admitting: Student

## 2018-06-03 LAB — CBC
HCT: 21.2 % — ABNORMAL LOW (ref 36.0–46.0)
Hemoglobin: 6.9 g/dL — CL (ref 12.0–15.0)
MCH: 32.2 pg (ref 26.0–34.0)
MCHC: 32.5 g/dL (ref 30.0–36.0)
MCV: 99.1 fL (ref 80.0–100.0)
Platelets: 128 10*3/uL — ABNORMAL LOW (ref 150–400)
RBC: 2.14 MIL/uL — ABNORMAL LOW (ref 3.87–5.11)
RDW: 12.3 % (ref 11.5–15.5)
WBC: 6.5 10*3/uL (ref 4.0–10.5)
nRBC: 0 % (ref 0.0–0.2)

## 2018-06-03 LAB — ABO/RH: ABO/RH(D): O POS

## 2018-06-03 LAB — BASIC METABOLIC PANEL
Anion gap: 8 (ref 5–15)
BUN: <5 mg/dL — ABNORMAL LOW (ref 6–20)
CO2: 29 mmol/L (ref 22–32)
Calcium: 8.2 mg/dL — ABNORMAL LOW (ref 8.9–10.3)
Chloride: 104 mmol/L (ref 98–111)
Creatinine, Ser: 0.59 mg/dL (ref 0.44–1.00)
GFR calc Af Amer: >60 mL/min (ref >60)
GFR calc non Af Amer: >60 mL/min (ref >60)
Glucose, Bld: 133 mg/dL — ABNORMAL HIGH (ref 70–99)
Potassium: 3.5 mmol/L (ref 3.5–5.1)
SODIUM: 141 mmol/L (ref 135–145)

## 2018-06-03 LAB — PREPARE RBC (CROSSMATCH)

## 2018-06-03 MED ORDER — SODIUM CHLORIDE 0.9% IV SOLUTION
Freq: Once | INTRAVENOUS | Status: AC
  Administered 2018-06-05: 23:00:00 via INTRAVENOUS

## 2018-06-03 MED ORDER — OXYCODONE HCL 5 MG PO TABS
5.0000 mg | ORAL_TABLET | ORAL | Status: DC | PRN
  Administered 2018-06-03 – 2018-06-06 (×13): 5 mg via ORAL
  Filled 2018-06-03 (×11): qty 1

## 2018-06-03 MED ORDER — OXYCODONE-ACETAMINOPHEN 5-325 MG PO TABS
1.0000 | ORAL_TABLET | ORAL | Status: DC | PRN
  Administered 2018-06-04 – 2018-06-08 (×8): 1 via ORAL
  Filled 2018-06-03 (×6): qty 1
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
## 2018-06-03 NOTE — Progress Notes (Signed)
Vitals:   06/02/18 1419 06/02/18 2000 06/02/18 2300 06/03/18 0300  BP: 112/74 110/70 100/63 114/76  Pulse: 89 74 87   Resp: (!) 25 13 18    Temp:  98.8 F (37.1 C) 98.5 F (36.9 C) 97.8 F (36.6 C)  TempSrc:  Oral Oral Oral  SpO2: 100% 99% 94% 97%  Weight:      Height:        CBC Recent Labs    06/02/18 0908 06/03/18 0136  WBC 7.6 6.5  HGB 7.5* 6.9*  HCT 23.3* 21.2*  PLT 126* 128*   BMET Recent Labs    06/02/18 0908 06/03/18 0136  NA 137 141  K 3.3* 3.5  CL 101 104  CO2 29 29  GLUCOSE 116* 133*  BUN <5* <5*  CREATININE 0.63 0.59  CALCIUM 8.0* 8.2*    Complaining of pain from her fractured sternum, her back, and her bilateral tib-fib fractures.  She describes that laying flat on her back aggravates her back pain, on the other hand logrolling also tends to aggravate her back pain.  Vertalign TLSO at bedside.  Plan: Patient can be mobilized in TLSO.  It needs to be donned and doffed in bed.  It needs to be on if she is sitting up and mobilized.  Okay to initiate PT and OT from neurosurgical perspective, if she can be mobilized from perspective of her other injuries.  Hewitt ShortsNUDELMAN,ROBERT W, MD 06/03/2018, 6:26 AM

## 2018-06-04 LAB — CBC
HCT: 24.6 % — ABNORMAL LOW (ref 36.0–46.0)
Hemoglobin: 8 g/dL — ABNORMAL LOW (ref 12.0–15.0)
MCH: 31.9 pg (ref 26.0–34.0)
MCHC: 32.5 g/dL (ref 30.0–36.0)
MCV: 98 fL (ref 80.0–100.0)
Platelets: 170 10*3/uL (ref 150–400)
RBC: 2.51 MIL/uL — ABNORMAL LOW (ref 3.87–5.11)
RDW: 13.3 % (ref 11.5–15.5)
WBC: 7.9 10*3/uL (ref 4.0–10.5)
nRBC: 0 % (ref 0.0–0.2)

## 2018-06-04 LAB — BASIC METABOLIC PANEL
Anion gap: 5 (ref 5–15)
BUN: <5 mg/dL — ABNORMAL LOW (ref 6–20)
CALCIUM: 8.1 mg/dL — AB (ref 8.9–10.3)
CO2: 30 mmol/L (ref 22–32)
Chloride: 107 mmol/L (ref 98–111)
Creatinine, Ser: 0.55 mg/dL (ref 0.44–1.00)
GFR calc Af Amer: >60 mL/min (ref >60)
GFR calc non Af Amer: >60 mL/min (ref >60)
Glucose, Bld: 116 mg/dL — ABNORMAL HIGH (ref 70–99)
Potassium: 3.2 mmol/L — ABNORMAL LOW (ref 3.5–5.1)
Sodium: 142 mmol/L (ref 135–145)

## 2018-06-04 LAB — BPAM RBC
Blood Product Expiration Date: 202001292359
ISSUE DATE / TIME: 201912310725
Unit Type and Rh: 5100

## 2018-06-04 LAB — TYPE AND SCREEN
ABO/RH(D): O POS
Antibody Screen: NEGATIVE
Unit division: 0

## 2018-06-04 MED ORDER — POTASSIUM CHLORIDE CRYS ER 20 MEQ PO TBCR
40.0000 meq | EXTENDED_RELEASE_TABLET | Freq: Two times a day (BID) | ORAL | Status: DC
  Administered 2018-06-04 – 2018-06-09 (×9): 40 meq via ORAL
  Filled 2018-06-04 (×9): qty 2

## 2018-06-04 MED ORDER — ZOLPIDEM TARTRATE 5 MG PO TABS
5.0000 mg | ORAL_TABLET | Freq: Every evening | ORAL | Status: DC | PRN
  Administered 2018-06-05: 5 mg via ORAL
  Filled 2018-06-04: qty 1
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
## 2018-06-05 ENCOUNTER — Inpatient Hospital Stay (HOSPITAL_COMMUNITY): Payer: Medicaid - Out of State

## 2018-06-05 ENCOUNTER — Encounter (HOSPITAL_COMMUNITY): Admission: EM | Disposition: A | Discharge status: Home Health | Payer: Self-pay | Source: Home / Self Care

## 2018-06-05 ENCOUNTER — Inpatient Hospital Stay (HOSPITAL_COMMUNITY): Payer: Medicaid - Out of State | Admitting: Certified Registered Nurse Anesthetist

## 2018-06-05 ENCOUNTER — Encounter (HOSPITAL_COMMUNITY): Payer: Self-pay

## 2018-06-05 HISTORY — PX: OPEN REDUCTION INTERNAL FIXATION (ORIF) TIBIA/FIBULA FRACTURE: SHX5992

## 2018-06-05 LAB — CBC
HCT: 29.9 % — ABNORMAL LOW (ref 36.0–46.0)
Hemoglobin: 9.4 g/dL — ABNORMAL LOW (ref 12.0–15.0)
MCH: 31 pg (ref 26.0–34.0)
MCHC: 31.4 g/dL (ref 30.0–36.0)
MCV: 98.7 fL (ref 80.0–100.0)
Platelets: 213 10*3/uL (ref 150–400)
RBC: 3.03 MIL/uL — AB (ref 3.87–5.11)
RDW: 13.2 % (ref 11.5–15.5)
WBC: 8.4 10*3/uL (ref 4.0–10.5)
nRBC: 0.2 % (ref 0.0–0.2)

## 2018-06-05 LAB — BASIC METABOLIC PANEL
Anion gap: 8 (ref 5–15)
BUN: 7 mg/dL (ref 6–20)
CO2: 28 mmol/L (ref 22–32)
Calcium: 8.5 mg/dL — ABNORMAL LOW (ref 8.9–10.3)
Chloride: 104 mmol/L (ref 98–111)
Creatinine, Ser: 0.57 mg/dL (ref 0.44–1.00)
GFR calc Af Amer: >60 mL/min (ref >60)
GFR calc non Af Amer: >60 mL/min (ref >60)
Glucose, Bld: 104 mg/dL — ABNORMAL HIGH (ref 70–99)
Potassium: 4.4 mmol/L (ref 3.5–5.1)
Sodium: 140 mmol/L (ref 135–145)

## 2018-06-05 SURGERY — OPEN REDUCTION INTERNAL FIXATION (ORIF) TIBIA/FIBULA FRACTURE
Anesthesia: General | Site: Leg Lower | Laterality: Left

## 2018-06-05 MED ORDER — MIDAZOLAM HCL 2 MG/2ML IJ SOLN
INTRAMUSCULAR | Status: AC
  Administered 2018-06-05: 2 mg via INTRAVENOUS
  Filled 2018-06-05: qty 2

## 2018-06-05 MED ORDER — FENTANYL CITRATE (PF) 100 MCG/2ML IJ SOLN: 25.0000 ug | INTRAMUSCULAR | Status: DC | PRN

## 2018-06-05 MED ORDER — FENTANYL CITRATE (PF) 100 MCG/2ML IJ SOLN
INTRAMUSCULAR | Status: AC
  Administered 2018-06-05: 100 ug via INTRAVENOUS
  Filled 2018-06-05: qty 2

## 2018-06-05 MED ORDER — PROMETHAZINE HCL 25 MG/ML IJ SOLN: 6.2500 mg | INTRAMUSCULAR | Status: DC | PRN

## 2018-06-05 MED ORDER — MIDAZOLAM HCL 2 MG/2ML IJ SOLN
INTRAMUSCULAR | Status: AC
  Filled 2018-06-05: qty 2

## 2018-06-05 MED ORDER — VANCOMYCIN HCL 1000 MG IV SOLR
INTRAVENOUS | Status: AC
  Filled 2018-06-05: qty 1000

## 2018-06-05 MED ORDER — ROCURONIUM BROMIDE 50 MG/5ML IV SOSY
PREFILLED_SYRINGE | INTRAVENOUS | Status: AC
  Filled 2018-06-05: qty 10

## 2018-06-05 MED ORDER — GLYCOPYRROLATE 0.2 MG/ML IJ SOLN
INTRAMUSCULAR | Status: DC | PRN
  Administered 2018-06-05 (×2): .1 mg via INTRAVENOUS

## 2018-06-05 MED ORDER — FENTANYL CITRATE (PF) 250 MCG/5ML IJ SOLN
INTRAMUSCULAR | Status: DC | PRN
  Administered 2018-06-05: 50 ug via INTRAVENOUS

## 2018-06-05 MED ORDER — 0.9 % SODIUM CHLORIDE (POUR BTL) OPTIME
TOPICAL | Status: DC | PRN
  Administered 2018-06-05: 1000 mL

## 2018-06-05 MED ORDER — CEFAZOLIN SODIUM-DEXTROSE 2-3 GM-%(50ML) IV SOLR
INTRAVENOUS | Status: DC | PRN
  Administered 2018-06-05: 2 g via INTRAVENOUS

## 2018-06-05 MED ORDER — LACTATED RINGERS IV SOLN
INTRAVENOUS | Status: DC
  Administered 2018-06-05 – 2018-06-06 (×2): via INTRAVENOUS

## 2018-06-05 MED ORDER — DEXAMETHASONE SODIUM PHOSPHATE 10 MG/ML IJ SOLN
INTRAMUSCULAR | Status: AC
  Filled 2018-06-05: qty 3

## 2018-06-05 MED ORDER — LIDOCAINE HCL (CARDIAC) PF 100 MG/5ML IV SOSY
PREFILLED_SYRINGE | INTRAVENOUS | Status: DC | PRN
  Administered 2018-06-05: 20 mg via INTRAVENOUS

## 2018-06-05 MED ORDER — MIDAZOLAM HCL 2 MG/2ML IJ SOLN
2.0000 mg | Freq: Once | INTRAMUSCULAR | Status: AC
  Administered 2018-06-05: 2 mg via INTRAVENOUS

## 2018-06-05 MED ORDER — SODIUM CHLORIDE 0.9 % IV SOLN
INTRAVENOUS | Status: DC | PRN
  Administered 2018-06-05: 30 ug/min via INTRAVENOUS

## 2018-06-05 MED ORDER — SUCCINYLCHOLINE CHLORIDE 200 MG/10ML IV SOSY
PREFILLED_SYRINGE | INTRAVENOUS | Status: AC
  Filled 2018-06-05: qty 10

## 2018-06-05 MED ORDER — BUPIVACAINE-EPINEPHRINE (PF) 0.5% -1:200000 IJ SOLN
INTRAMUSCULAR | Status: DC | PRN
  Administered 2018-06-05: 25 mL via PERINEURAL

## 2018-06-05 MED ORDER — PHENYLEPHRINE 40 MCG/ML (10ML) SYRINGE FOR IV PUSH (FOR BLOOD PRESSURE SUPPORT)
PREFILLED_SYRINGE | INTRAVENOUS | Status: AC
  Filled 2018-06-05: qty 20

## 2018-06-05 MED ORDER — CLONIDINE HCL (ANALGESIA) 100 MCG/ML EP SOLN
EPIDURAL | Status: DC | PRN
  Administered 2018-06-05 (×2): 50 ug

## 2018-06-05 MED ORDER — PROPOFOL 10 MG/ML IV BOLUS
INTRAVENOUS | Status: DC | PRN
  Administered 2018-06-05: 200 mg via INTRAVENOUS

## 2018-06-05 MED ORDER — EPHEDRINE SULFATE-NACL 50-0.9 MG/10ML-% IV SOSY
PREFILLED_SYRINGE | INTRAVENOUS | Status: DC | PRN
  Administered 2018-06-05: 5 mg via INTRAVENOUS
  Administered 2018-06-05: 10 mg via INTRAVENOUS

## 2018-06-05 MED ORDER — DEXAMETHASONE SODIUM PHOSPHATE 10 MG/ML IJ SOLN
INTRAMUSCULAR | Status: DC | PRN
  Administered 2018-06-05: 10 mg via INTRAVENOUS

## 2018-06-05 MED ORDER — LIDOCAINE 2% (20 MG/ML) 5 ML SYRINGE
INTRAMUSCULAR | Status: AC
  Filled 2018-06-05: qty 10

## 2018-06-05 MED ORDER — MIDAZOLAM HCL 2 MG/2ML IJ SOLN
INTRAMUSCULAR | Status: DC | PRN
  Administered 2018-06-05: 1 mg via INTRAVENOUS

## 2018-06-05 MED ORDER — ONDANSETRON HCL 4 MG/2ML IJ SOLN
INTRAMUSCULAR | Status: DC | PRN
  Administered 2018-06-05: 4 mg via INTRAVENOUS

## 2018-06-05 MED ORDER — ONDANSETRON HCL 4 MG/2ML IJ SOLN
INTRAMUSCULAR | Status: AC
  Filled 2018-06-05: qty 6

## 2018-06-05 MED ORDER — PROPOFOL 10 MG/ML IV BOLUS
INTRAVENOUS | Status: AC
  Filled 2018-06-05: qty 20

## 2018-06-05 MED ORDER — FENTANYL CITRATE (PF) 100 MCG/2ML IJ SOLN
100.0000 ug | Freq: Once | INTRAMUSCULAR | Status: AC
  Administered 2018-06-05: 100 ug via INTRAVENOUS

## 2018-06-05 MED ORDER — CEFAZOLIN SODIUM-DEXTROSE 2-4 GM/100ML-% IV SOLN
2.0000 g | Freq: Three times a day (TID) | INTRAVENOUS | Status: AC
  Administered 2018-06-05: 2 g via INTRAVENOUS
  Administered 2018-06-06: 1 g via INTRAVENOUS
  Administered 2018-06-06: 2 g via INTRAVENOUS
  Filled 2018-06-05 (×2): qty 100

## 2018-06-05 MED ORDER — HYDRALAZINE HCL 20 MG/ML IJ SOLN
INTRAMUSCULAR | Status: AC
  Filled 2018-06-05: qty 1

## 2018-06-05 MED ORDER — EPHEDRINE 5 MG/ML INJ
INTRAVENOUS | Status: AC
  Filled 2018-06-05: qty 10

## 2018-06-05 MED ORDER — ROPIVACAINE HCL 5 MG/ML IJ SOLN
INTRAMUSCULAR | Status: DC | PRN
  Administered 2018-06-05: 20 mL via PERINEURAL

## 2018-06-05 MED ORDER — PHENYLEPHRINE 40 MCG/ML (10ML) SYRINGE FOR IV PUSH (FOR BLOOD PRESSURE SUPPORT)
PREFILLED_SYRINGE | INTRAVENOUS | Status: DC | PRN
  Administered 2018-06-05 (×2): 160 ug via INTRAVENOUS

## 2018-06-05 MED ORDER — FENTANYL CITRATE (PF) 250 MCG/5ML IJ SOLN
INTRAMUSCULAR | Status: AC
  Filled 2018-06-05: qty 5

## 2018-06-05 SURGICAL SUPPLY — 74 items
BANDAGE ACE 4X5 VEL STRL LF (GAUZE/BANDAGES/DRESSINGS) ×2 IMPLANT
BANDAGE ACE 6X5 VEL STRL LF (GAUZE/BANDAGES/DRESSINGS) ×2 IMPLANT
BANDAGE ESMARK 6X9 LF (GAUZE/BANDAGES/DRESSINGS) ×1 IMPLANT
BIT DRILL 2.5X2.75 QC CALB (BIT) ×1 IMPLANT
BIT DRILL 3.5X5.5 QC CALB (BIT) ×1 IMPLANT
BIT DRILL CALIBRATED 2.7 (BIT) ×1 IMPLANT
BLADE CLIPPER SURG (BLADE) IMPLANT
BNDG COHESIVE 4X5 TAN STRL (GAUZE/BANDAGES/DRESSINGS) IMPLANT
BNDG ESMARK 6X9 LF (GAUZE/BANDAGES/DRESSINGS) ×2
BNDG GAUZE ELAST 4 BULKY (GAUZE/BANDAGES/DRESSINGS) ×2 IMPLANT
BRUSH SCRUB SURG 4.25 DISP (MISCELLANEOUS) ×4 IMPLANT
CHLORAPREP W/TINT 26ML (MISCELLANEOUS) ×2 IMPLANT
COVER MAYO STAND STRL (DRAPES) ×2 IMPLANT
COVER WAND RF STERILE (DRAPES) ×2 IMPLANT
DRAPE C-ARM 42X72 X-RAY (DRAPES) ×2 IMPLANT
DRAPE C-ARMOR (DRAPES) ×2 IMPLANT
DRAPE HALF SHEET 40X57 (DRAPES) ×4 IMPLANT
DRAPE INCISE IOBAN 66X45 STRL (DRAPES) ×2 IMPLANT
DRAPE U-SHAPE 47X51 STRL (DRAPES) ×2 IMPLANT
DRSG ADAPTIC 3X8 NADH LF (GAUZE/BANDAGES/DRESSINGS) ×2 IMPLANT
DRSG PAD ABDOMINAL 8X10 ST (GAUZE/BANDAGES/DRESSINGS) ×8 IMPLANT
ELECT REM PT RETURN 9FT ADLT (ELECTROSURGICAL) ×2
ELECTRODE REM PT RTRN 9FT ADLT (ELECTROSURGICAL) ×1 IMPLANT
GAUZE SPONGE 4X4 12PLY STRL (GAUZE/BANDAGES/DRESSINGS) ×2 IMPLANT
GLOVE BIO SURGEON STRL SZ 6.5 (GLOVE) ×6 IMPLANT
GLOVE BIO SURGEON STRL SZ7.5 (GLOVE) ×8 IMPLANT
GLOVE BIOGEL PI IND STRL 6.5 (GLOVE) ×1 IMPLANT
GLOVE BIOGEL PI IND STRL 7.5 (GLOVE) ×1 IMPLANT
GLOVE BIOGEL PI INDICATOR 6.5 (GLOVE) ×1
GLOVE BIOGEL PI INDICATOR 7.5 (GLOVE) ×1
GLOVE PROGUARD SZ 7 1/2 (GLOVE) ×2 IMPLANT
GOWN STRL REUS W/ TWL LRG LVL3 (GOWN DISPOSABLE) ×2 IMPLANT
GOWN STRL REUS W/TWL LRG LVL3 (GOWN DISPOSABLE) ×2
K-WIRE ACE 1.6X6 (WIRE) ×4
KIT BASIN OR (CUSTOM PROCEDURE TRAY) ×2 IMPLANT
KIT TURNOVER KIT B (KITS) ×2 IMPLANT
KWIRE ACE 1.6X6 (WIRE) IMPLANT
MANIFOLD NEPTUNE II (INSTRUMENTS) ×2 IMPLANT
NS IRRIG 1000ML POUR BTL (IV SOLUTION) ×2 IMPLANT
PACK TOTAL JOINT (CUSTOM PROCEDURE TRAY) ×2 IMPLANT
PAD ARMBOARD 7.5X6 YLW CONV (MISCELLANEOUS) ×4 IMPLANT
PAD CAST 4YDX4 CTTN HI CHSV (CAST SUPPLIES) ×1 IMPLANT
PADDING CAST COTTON 4X4 STRL (CAST SUPPLIES) ×1
PADDING CAST COTTON 6X4 STRL (CAST SUPPLIES) ×2 IMPLANT
PLATE LOCK 12H LT MED DIST TIB (Plate) ×1 IMPLANT
SCREW CORTICAL 3.5MM 24MM (Screw) ×3 IMPLANT
SCREW CORTICAL 3.5MM 26MM (Screw) ×1 IMPLANT
SCREW CORTICAL 3.5MM 36MM (Screw) ×1 IMPLANT
SCREW CORTICAL 3.5MM 38MM (Screw) ×1 IMPLANT
SCREW LOCK CORT STAR 3.5X42 (Screw) ×1 IMPLANT
SCREW T15 LP CORT 3.5X38MM NS (Screw) ×1 IMPLANT
SCREW T15 LP CORT 3.5X42MM NS (Screw) ×1 IMPLANT
SCREW T15 MD 3.5X34MM NS (Screw) ×1 IMPLANT
SCREW T15 MD 3.5X42MM NS (Screw) ×1 IMPLANT
SPONGE LAP 18X18 X RAY DECT (DISPOSABLE) IMPLANT
STAPLER VISISTAT 35W (STAPLE) ×2 IMPLANT
STOCKINETTE IMPERVIOUS LG (DRAPES) IMPLANT
SUCTION FRAZIER HANDLE 10FR (MISCELLANEOUS) ×1
SUCTION TUBE FRAZIER 10FR DISP (MISCELLANEOUS) ×1 IMPLANT
SUT ETHILON 3 0 PS 1 (SUTURE) IMPLANT
SUT MNCRL AB 3-0 PS2 18 (SUTURE) ×2 IMPLANT
SUT PROLENE 0 CT (SUTURE) IMPLANT
SUT VIC AB 0 CT1 27 (SUTURE) ×1
SUT VIC AB 0 CT1 27XBRD ANBCTR (SUTURE) ×1 IMPLANT
SUT VIC AB 1 CT1 27 (SUTURE) ×1
SUT VIC AB 1 CT1 27XBRD ANBCTR (SUTURE) ×1 IMPLANT
SUT VIC AB 2-0 CT1 27 (SUTURE) ×2
SUT VIC AB 2-0 CT1 TAPERPNT 27 (SUTURE) ×2 IMPLANT
TOWEL OR 17X24 6PK STRL BLUE (TOWEL DISPOSABLE) ×2 IMPLANT
TOWEL OR 17X26 10 PK STRL BLUE (TOWEL DISPOSABLE) ×4 IMPLANT
TRAY FOLEY MTR SLVR 16FR STAT (SET/KITS/TRAYS/PACK) IMPLANT
TUBE CONNECTING 12X1/4 (SUCTIONS) ×2 IMPLANT
WATER STERILE IRR 1000ML POUR (IV SOLUTION) ×4 IMPLANT
YANKAUER SUCT BULB TIP NO VENT (SUCTIONS) ×2 IMPLANT

## 2018-06-05 NOTE — Anesthesia Procedure Notes (Signed)
Procedure Name: LMA Insertion Date/Time: 06/05/2018 3:50 PM Performed by: Burt Ek, CRNA Pre-anesthesia Checklist: Patient identified, Emergency Drugs available, Suction available and Patient being monitored Patient Re-evaluated:Patient Re-evaluated prior to induction Oxygen Delivery Method: Circle system utilized Preoxygenation: Pre-oxygenation with 100% oxygen Induction Type: IV induction Ventilation: Mask ventilation without difficulty LMA: LMA inserted LMA Size: 4.0 Number of attempts: 1 Placement Confirmation: positive ETCO2 and breath sounds checked- equal and bilateral Tube secured with: Tape Dental Injury: Teeth and Oropharynx as per pre-operative assessment

## 2018-06-05 NOTE — Interval H&P Note (Signed)
History and Physical Interval Note:  06/05/2018 2:56 PM  Judy Hernandez  has presented today for surgery, with the diagnosis of Left open tibia fracture  The various methods of treatment have been discussed with the patient and family. After consideration of risks, benefits and other options for treatment, the patient has consented to  Procedure(s): OPEN REDUCTION INTERNAL FIXATION (ORIF) TIBIA/FIBULA FRACTURE (Left) as a surgical intervention .  The patient's history has been reviewed, patient examined, no change in status, stable for surgery.  I have reviewed the patient's chart and labs.  Questions were answered to the patient's satisfaction.     Caryn Bee P Haddix

## 2018-06-05 NOTE — Progress Notes (Signed)
Vitals:   06/04/18 2300 06/05/18 0300 06/05/18 0856 06/05/18 0858  BP: 118/68 95/62 106/65   Pulse:   63 64  Resp: 18  14   Temp: 98.6 F (37 C) 98.8 F (37.1 C)  98.6 F (37 C)  TempSrc: Oral Oral    SpO2:  95% 99% 97%  Weight:      Height:        CBC Recent Labs    06/03/18 0136 06/04/18 0329  WBC 6.5 7.9  HGB 6.9* 8.0*  HCT 21.2* 24.6*  PLT 128* 170   BMET Recent Labs    06/03/18 0136 06/04/18 0329  NA 141 142  K 3.5 3.2*  CL 104 107  CO2 29 30  GLUCOSE 133* 116*  BUN <5* <5*  CREATININE 0.59 0.55  CALCIUM 8.2* 8.1*    Patient resting in bed, back pain has been steadily lessening.  Scheduled to return to the OR for further orthopedic surgery today.  Patient is okay from neurosurgical perspective to mobilize in a TLSO (to be donned and doffed in bed); however decision and orders regarding regarding activities and mobilization is per orthopedics and trauma surgery.  Plan: Discussed my assessment and recommendations with Dr. Violeta Gelinas from trauma surgery this morning.  Symptomatically doing better from a neurosurgical perspective, back pain has steadily lessened.  Mobilization per orthopedics and trauma surgery (will need to be done in TLSO, which is to be donned and doffed in bed).  Hewitt Shorts, MD 06/05/2018, 10:54 AM
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
## 2018-06-05 NOTE — Plan of Care (Signed)

## 2018-06-05 NOTE — Anesthesia Procedure Notes (Signed)
Anesthesia Regional Block: Popliteal block   Pre-Anesthetic Checklist: ,, timeout performed, Correct Patient, Correct Site, Correct Laterality, Correct Procedure, Correct Position, site marked, Risks and benefits discussed,  Surgical consent,  Pre-op evaluation,  At surgeon's request and post-op pain management  Laterality: Left  Prep: chloraprep       Needles:  Injection technique: Single-shot  Needle Type: Echogenic Needle     Needle Length: 9cm  Needle Gauge: 21     Additional Needles:   Procedures:,,,, ultrasound used (permanent image in chart),,,,  Narrative:  Start time: 06/05/2018 2:28 PM End time: 06/05/2018 2:35 PM Injection made incrementally with aspirations every 5 mL.  Performed by: Personally  Anesthesiologist: Marcene Duos, MD

## 2018-06-05 NOTE — Transfer of Care (Signed)
Immediate Anesthesia Transfer of Care Note  Patient: Judy Hernandez  Procedure(s) Performed: OPEN REDUCTION INTERNAL FIXATION (ORIF) TIBIA/FIBULA FRACTURE (Left Leg Lower)  Patient Location: PACU  Anesthesia Type:GA combined with regional for post-op pain  Level of Consciousness: awake and patient cooperative  Airway & Oxygen Therapy: Patient Spontanous Breathing and Patient connected to nasal cannula oxygen  Post-op Assessment: Report given to RN, Post -op Vital signs reviewed and stable and Patient moving all extremities  Post vital signs: Reviewed and stable  Last Vitals:  Vitals Value Taken Time  BP 101/62 06/05/2018  5:36 PM  Temp    Pulse 76 06/05/2018  5:36 PM  Resp 11 06/05/2018  5:36 PM  SpO2 96 % 06/05/2018  5:36 PM  Vitals shown include unvalidated device data.  Last Pain:  Vitals:   06/05/18 1120  TempSrc: Oral  PainSc:       Patients Stated Pain Goal: 2 (06/04/18 2006)  Complications: No apparent anesthesia complications

## 2018-06-05 NOTE — Anesthesia Preprocedure Evaluation (Addendum)
Anesthesia Evaluation  Patient identified by MRN, date of birth, ID band Patient awake    Reviewed: Allergy & Precautions, NPO status , Patient's Chart, lab work & pertinent test results  History of Anesthesia Complications Negative for: history of anesthetic complications  Airway Mallampati: II  TM Distance: >3 FB Neck ROM: Full    Dental  (+) Dental Advisory Given, Edentulous Upper, Missing,    Pulmonary former smoker,    Pulmonary exam normal breath sounds clear to auscultation       Cardiovascular negative cardio ROS Normal cardiovascular exam Rhythm:Regular Rate:Normal     Neuro/Psych Anxiety Depression negative neurological ROS     GI/Hepatic negative GI ROS, Neg liver ROS,   Endo/Other  negative endocrine ROS  Renal/GU negative Renal ROS     Musculoskeletal  (+) Arthritis ,   Abdominal   Peds  Hematology negative hematology ROS (+)   Anesthesia Other Findings Day of surgery medications reviewed with the patient.  Reproductive/Obstetrics                            Anesthesia Physical Anesthesia Plan  ASA: II  Anesthesia Plan: General   Post-op Pain Management:    Induction: Intravenous  PONV Risk Score and Plan: 3 and Treatment may vary due to age or medical condition, Ondansetron, Dexamethasone and Midazolam  Airway Management Planned: LMA  Additional Equipment:   Intra-op Plan:   Post-operative Plan: Extubation in OR  Informed Consent: I have reviewed the patients History and Physical, chart, labs and discussed the procedure including the risks, benefits and alternatives for the proposed anesthesia with the patient or authorized representative who has indicated his/her understanding and acceptance.   Dental advisory given  Plan Discussed with: CRNA  Anesthesia Plan Comments: (Pt prefers no regional anesthesia.)      Anesthesia Quick Evaluation

## 2018-06-05 NOTE — Anesthesia Preprocedure Evaluation (Addendum)
Anesthesia Evaluation  Patient identified by MRN, date of birth, ID band Patient awake    Reviewed: Allergy & Precautions, NPO status , Patient's Chart, lab work & pertinent test results  Airway Mallampati: II  TM Distance: >3 FB Neck ROM: Full    Dental  (+) Dental Advisory Given   Pulmonary former smoker,    breath sounds clear to auscultation       Cardiovascular negative cardio ROS   Rhythm:Regular Rate:Normal     Neuro/Psych negative neurological ROS     GI/Hepatic negative GI ROS, Neg liver ROS,   Endo/Other  negative endocrine ROS  Renal/GU negative Renal ROS     Musculoskeletal  (+) Arthritis ,   Abdominal   Peds  Hematology  (+) anemia ,   Anesthesia Other Findings   Reproductive/Obstetrics                            Lab Results  Component Value Date   WBC 8.4 06/05/2018   HGB 9.4 (L) 06/05/2018   HCT 29.9 (L) 06/05/2018   MCV 98.7 06/05/2018   PLT 213 06/05/2018   Lab Results  Component Value Date   CREATININE 0.57 06/05/2018   BUN 7 06/05/2018   NA 140 06/05/2018   K 4.4 06/05/2018   CL 104 06/05/2018   CO2 28 06/05/2018    Anesthesia Physical Anesthesia Plan  ASA: II  Anesthesia Plan: General   Post-op Pain Management:  Regional for Post-op pain   Induction: Intravenous  PONV Risk Score and Plan: 3 and Dexamethasone, Ondansetron, Treatment may vary due to age or medical condition and Midazolam  Airway Management Planned: LMA  Additional Equipment:   Intra-op Plan:   Post-operative Plan: Extubation in OR  Informed Consent: I have reviewed the patients History and Physical, chart, labs and discussed the procedure including the risks, benefits and alternatives for the proposed anesthesia with the patient or authorized representative who has indicated his/her understanding and acceptance.   Dental advisory given  Plan Discussed with:  CRNA  Anesthesia Plan Comments:         Anesthesia Quick Evaluation

## 2018-06-05 NOTE — Anesthesia Procedure Notes (Signed)
Anesthesia Regional Block: Adductor canal block   Pre-Anesthetic Checklist: ,, timeout performed, Correct Patient, Correct Site, Correct Laterality, Correct Procedure, Correct Position, site marked, Risks and benefits discussed,  Surgical consent,  Pre-op evaluation,  At surgeon's request and post-op pain management  Laterality: Left  Prep: chloraprep       Needles:  Injection technique: Single-shot  Needle Type: Echogenic Needle     Needle Length: 9cm  Needle Gauge: 21     Additional Needles:   Procedures:,,,, ultrasound used (permanent image in chart),,,,  Narrative:  Start time: 06/05/2018 2:36 PM End time: 06/05/2018 2:43 PM Injection made incrementally with aspirations every 5 mL.  Performed by: Personally  Anesthesiologist: Marcene Duos, MD
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
## 2018-06-06 ENCOUNTER — Encounter (HOSPITAL_COMMUNITY): Payer: Self-pay | Admitting: Student

## 2018-06-06 ENCOUNTER — Inpatient Hospital Stay (HOSPITAL_COMMUNITY): Payer: Medicaid - Out of State

## 2018-06-06 ENCOUNTER — Encounter (HOSPITAL_COMMUNITY): Admission: EM | Disposition: A | Discharge status: Home Health | Payer: Self-pay | Source: Home / Self Care

## 2018-06-06 ENCOUNTER — Inpatient Hospital Stay (HOSPITAL_COMMUNITY): Payer: Medicaid - Out of State | Admitting: Anesthesiology

## 2018-06-06 HISTORY — PX: OPEN REDUCTION INTERNAL FIXATION (ORIF) TIBIA/FIBULA FRACTURE: SHX5992

## 2018-06-06 LAB — CBC
HEMATOCRIT: 29.3 % — AB (ref 36.0–46.0)
Hemoglobin: 9.4 g/dL — ABNORMAL LOW (ref 12.0–15.0)
MCH: 31.6 pg (ref 26.0–34.0)
MCHC: 32.1 g/dL (ref 30.0–36.0)
MCV: 98.7 fL (ref 80.0–100.0)
Platelets: 254 10*3/uL (ref 150–400)
RBC: 2.97 MIL/uL — ABNORMAL LOW (ref 3.87–5.11)
RDW: 12.9 % (ref 11.5–15.5)
WBC: 8 10*3/uL (ref 4.0–10.5)
nRBC: 0 % (ref 0.0–0.2)

## 2018-06-06 LAB — TYPE AND SCREEN
ABO/RH(D): O POS
Antibody Screen: NEGATIVE

## 2018-06-06 LAB — BASIC METABOLIC PANEL
Anion gap: 7 (ref 5–15)
BUN: 8 mg/dL (ref 6–20)
CHLORIDE: 102 mmol/L (ref 98–111)
CO2: 29 mmol/L (ref 22–32)
Calcium: 9 mg/dL (ref 8.9–10.3)
Creatinine, Ser: 0.65 mg/dL (ref 0.44–1.00)
GFR calc Af Amer: >60 mL/min (ref >60)
GFR calc non Af Amer: >60 mL/min (ref >60)
Glucose, Bld: 119 mg/dL — ABNORMAL HIGH (ref 70–99)
Potassium: 4.6 mmol/L (ref 3.5–5.1)
SODIUM: 138 mmol/L (ref 135–145)

## 2018-06-06 SURGERY — OPEN REDUCTION INTERNAL FIXATION (ORIF) TIBIA/FIBULA FRACTURE
Anesthesia: General | Laterality: Right

## 2018-06-06 MED ORDER — 0.9 % SODIUM CHLORIDE (POUR BTL) OPTIME
TOPICAL | Status: DC | PRN
  Administered 2018-06-06: 3000 mL

## 2018-06-06 MED ORDER — VANCOMYCIN HCL 1000 MG IV SOLR
INTRAVENOUS | Status: DC | PRN
  Administered 2018-06-06: 1000 mg

## 2018-06-06 MED ORDER — SODIUM CHLORIDE (PF) 0.9 % IJ SOLN
INTRAMUSCULAR | Status: AC
  Filled 2018-06-06: qty 20

## 2018-06-06 MED ORDER — ENOXAPARIN SODIUM 40 MG/0.4ML ~~LOC~~ SOLN
40.0000 mg | SUBCUTANEOUS | Status: DC
  Administered 2018-06-07 – 2018-06-12 (×6): 40 mg via SUBCUTANEOUS
  Filled 2018-06-06 (×6): qty 0.4

## 2018-06-06 MED ORDER — HYDROMORPHONE HCL 1 MG/ML IJ SOLN
INTRAMUSCULAR | Status: DC | PRN
  Administered 2018-06-06 (×2): 0.5 mg via INTRAVENOUS

## 2018-06-06 MED ORDER — METHYLENE BLUE 0.5 % INJ SOLN
INTRAVENOUS | Status: AC
  Filled 2018-06-06: qty 10

## 2018-06-06 MED ORDER — FAMOTIDINE 20 MG PO TABS
10.0000 mg | ORAL_TABLET | Freq: Every day | ORAL | Status: DC
  Administered 2018-06-07 – 2018-06-12 (×7): 10 mg via ORAL
  Filled 2018-06-06 (×7): qty 1

## 2018-06-06 MED ORDER — PHENYLEPHRINE 40 MCG/ML (10ML) SYRINGE FOR IV PUSH (FOR BLOOD PRESSURE SUPPORT)
PREFILLED_SYRINGE | INTRAVENOUS | Status: DC | PRN
  Administered 2018-06-06 (×2): 80 ug via INTRAVENOUS
  Administered 2018-06-06: 50 ug via INTRAVENOUS
  Administered 2018-06-06 (×3): 80 ug via INTRAVENOUS

## 2018-06-06 MED ORDER — CEFAZOLIN SODIUM 1 G IJ SOLR
INTRAMUSCULAR | Status: AC
  Filled 2018-06-06: qty 10

## 2018-06-06 MED ORDER — VANCOMYCIN HCL 1000 MG IV SOLR
INTRAVENOUS | Status: AC
  Filled 2018-06-06: qty 1000

## 2018-06-06 MED ORDER — DEXAMETHASONE SODIUM PHOSPHATE 10 MG/ML IJ SOLN
INTRAMUSCULAR | Status: AC
  Filled 2018-06-06: qty 1

## 2018-06-06 MED ORDER — MIDAZOLAM HCL 2 MG/2ML IJ SOLN
INTRAMUSCULAR | Status: AC
  Filled 2018-06-06: qty 2

## 2018-06-06 MED ORDER — FENTANYL CITRATE (PF) 250 MCG/5ML IJ SOLN
INTRAMUSCULAR | Status: AC
  Filled 2018-06-06: qty 5

## 2018-06-06 MED ORDER — PHENYLEPHRINE 40 MCG/ML (10ML) SYRINGE FOR IV PUSH (FOR BLOOD PRESSURE SUPPORT)
PREFILLED_SYRINGE | INTRAVENOUS | Status: AC
  Filled 2018-06-06: qty 10

## 2018-06-06 MED ORDER — TOBRAMYCIN SULFATE 1.2 G IJ SOLR
INTRAMUSCULAR | Status: DC | PRN
  Administered 2018-06-06: 1.2 g

## 2018-06-06 MED ORDER — HYDROMORPHONE HCL 1 MG/ML IJ SOLN
INTRAMUSCULAR | Status: AC
  Filled 2018-06-06: qty 1

## 2018-06-06 MED ORDER — FENTANYL CITRATE (PF) 100 MCG/2ML IJ SOLN
INTRAMUSCULAR | Status: AC
  Administered 2018-06-06: 50 ug via INTRAVENOUS
  Filled 2018-06-06: qty 2

## 2018-06-06 MED ORDER — CEFAZOLIN SODIUM-DEXTROSE 2-4 GM/100ML-% IV SOLN
2.0000 g | Freq: Three times a day (TID) | INTRAVENOUS | Status: AC
  Administered 2018-06-06 – 2018-06-07 (×3): 2 g via INTRAVENOUS
  Filled 2018-06-06 (×3): qty 100

## 2018-06-06 MED ORDER — DEXAMETHASONE SODIUM PHOSPHATE 4 MG/ML IJ SOLN
INTRAMUSCULAR | Status: DC | PRN
  Administered 2018-06-06: 5 mg via INTRAVENOUS

## 2018-06-06 MED ORDER — ACETAMINOPHEN 10 MG/ML IV SOLN: 1000.0000 mg | Freq: Once | INTRAVENOUS | Status: DC | PRN

## 2018-06-06 MED ORDER — PROPOFOL 10 MG/ML IV BOLUS
INTRAVENOUS | Status: AC
  Filled 2018-06-06: qty 20

## 2018-06-06 MED ORDER — PROMETHAZINE HCL 25 MG/ML IJ SOLN: 6.2500 mg | INTRAMUSCULAR | Status: DC | PRN

## 2018-06-06 MED ORDER — FENTANYL CITRATE (PF) 100 MCG/2ML IJ SOLN
INTRAMUSCULAR | Status: AC
  Filled 2018-06-06: qty 2

## 2018-06-06 MED ORDER — HYDROMORPHONE HCL 1 MG/ML IJ SOLN
0.2500 mg | INTRAMUSCULAR | Status: DC | PRN
  Administered 2018-06-06 (×4): 0.5 mg via INTRAVENOUS

## 2018-06-06 MED ORDER — BACITRACIN ZINC 500 UNIT/GM EX OINT
TOPICAL_OINTMENT | CUTANEOUS | Status: AC
  Filled 2018-06-06: qty 28.35

## 2018-06-06 MED ORDER — ONDANSETRON HCL 4 MG/2ML IJ SOLN
INTRAMUSCULAR | Status: AC
  Filled 2018-06-06: qty 2

## 2018-06-06 MED ORDER — EPHEDRINE 5 MG/ML INJ
INTRAVENOUS | Status: AC
  Filled 2018-06-06: qty 10

## 2018-06-06 MED ORDER — KETOROLAC TROMETHAMINE 15 MG/ML IJ SOLN
15.0000 mg | Freq: Four times a day (QID) | INTRAMUSCULAR | Status: AC
  Administered 2018-06-06 – 2018-06-07 (×5): 15 mg via INTRAVENOUS
  Filled 2018-06-06 (×5): qty 1

## 2018-06-06 MED ORDER — LIDOCAINE 2% (20 MG/ML) 5 ML SYRINGE
INTRAMUSCULAR | Status: AC
  Filled 2018-06-06: qty 5

## 2018-06-06 MED ORDER — TOBRAMYCIN SULFATE 1.2 G IJ SOLR
INTRAMUSCULAR | Status: AC
  Filled 2018-06-06: qty 1.2

## 2018-06-06 MED ORDER — FENTANYL CITRATE (PF) 100 MCG/2ML IJ SOLN
50.0000 ug | Freq: Once | INTRAMUSCULAR | Status: AC
  Administered 2018-06-06: 50 ug via INTRAVENOUS

## 2018-06-06 MED ORDER — LIDOCAINE 2% (20 MG/ML) 5 ML SYRINGE
INTRAMUSCULAR | Status: DC | PRN
  Administered 2018-06-06: 100 mg via INTRAVENOUS

## 2018-06-06 MED ORDER — OXYCODONE HCL 5 MG/5ML PO SOLN: 5.0000 mg | Freq: Once | ORAL | Status: DC | PRN

## 2018-06-06 MED ORDER — MIDAZOLAM HCL 5 MG/5ML IJ SOLN
INTRAMUSCULAR | Status: DC | PRN
  Administered 2018-06-06: 2 mg via INTRAVENOUS

## 2018-06-06 MED ORDER — HYDROMORPHONE HCL 1 MG/ML IJ SOLN
INTRAMUSCULAR | Status: AC
  Administered 2018-06-06: 0.5 mg via INTRAVENOUS
  Filled 2018-06-06: qty 1

## 2018-06-06 MED ORDER — FENTANYL CITRATE (PF) 100 MCG/2ML IJ SOLN
INTRAMUSCULAR | Status: DC | PRN
  Administered 2018-06-06: 25 ug via INTRAVENOUS
  Administered 2018-06-06: 50 ug via INTRAVENOUS
  Administered 2018-06-06: 25 ug via INTRAVENOUS
  Administered 2018-06-06 (×3): 50 ug via INTRAVENOUS

## 2018-06-06 MED ORDER — EPHEDRINE SULFATE-NACL 50-0.9 MG/10ML-% IV SOSY
PREFILLED_SYRINGE | INTRAVENOUS | Status: DC | PRN
  Administered 2018-06-06 (×4): 10 mg via INTRAVENOUS

## 2018-06-06 MED ORDER — PROPOFOL 10 MG/ML IV BOLUS
INTRAVENOUS | Status: DC | PRN
  Administered 2018-06-06: 120 mg via INTRAVENOUS

## 2018-06-06 MED ORDER — OXYCODONE HCL 5 MG PO TABS: 5.0000 mg | ORAL_TABLET | Freq: Once | ORAL | Status: DC | PRN

## 2018-06-06 MED ORDER — OXYCODONE HCL 5 MG PO TABS
10.0000 mg | ORAL_TABLET | ORAL | Status: DC | PRN
  Administered 2018-06-06 – 2018-06-08 (×7): 10 mg via ORAL
  Filled 2018-06-06 (×7): qty 2

## 2018-06-06 MED ORDER — ONDANSETRON HCL 4 MG/2ML IJ SOLN
INTRAMUSCULAR | Status: DC | PRN
  Administered 2018-06-06: 4 mg via INTRAVENOUS

## 2018-06-06 SURGICAL SUPPLY — 58 items
BANDAGE ESMARK 6X9 LF (GAUZE/BANDAGES/DRESSINGS) ×1 IMPLANT
BIT DRILL 2.5X2.75 QC CALB (BIT) ×1 IMPLANT
BIT DRILL 3.5X5.5 QC CALB (BIT) ×1 IMPLANT
BIT DRILL CALIBRATED 2.7 (BIT) ×1 IMPLANT
BIT DRILL WIN 3.0 (BIT) ×1 IMPLANT
BNDG COHESIVE 4X5 TAN STRL (GAUZE/BANDAGES/DRESSINGS) ×1 IMPLANT
BNDG ELASTIC 6X15 VLCR STRL LF (GAUZE/BANDAGES/DRESSINGS) ×1 IMPLANT
BNDG ESMARK 6X9 LF (GAUZE/BANDAGES/DRESSINGS) ×2
BOWL SMART MIX CTS (DISPOSABLE) ×1 IMPLANT
BRUSH SCRUB SURG 4.25 DISP (MISCELLANEOUS) ×4 IMPLANT
CEMENT BONE R 1X40 (Cement) ×1 IMPLANT
CHLORAPREP W/TINT 26ML (MISCELLANEOUS) ×2 IMPLANT
DRAPE C-ARM 42X72 X-RAY (DRAPES) ×2 IMPLANT
DRAPE C-ARMOR (DRAPES) ×2 IMPLANT
DRAPE U-SHAPE 47X51 STRL (DRAPES) ×2 IMPLANT
DRSG PAD ABDOMINAL 8X10 ST (GAUZE/BANDAGES/DRESSINGS) ×8 IMPLANT
ELECT REM PT RETURN 9FT ADLT (ELECTROSURGICAL) ×2
ELECTRODE REM PT RTRN 9FT ADLT (ELECTROSURGICAL) ×1 IMPLANT
GAUZE SPONGE 4X4 12PLY STRL (GAUZE/BANDAGES/DRESSINGS) ×2 IMPLANT
GAUZE SPONGE 4X4 12PLY STRL LF (GAUZE/BANDAGES/DRESSINGS) ×1 IMPLANT
GLOVE BIO SURGEON STRL SZ 6.5 (GLOVE) ×6 IMPLANT
GLOVE BIO SURGEON STRL SZ7.5 (GLOVE) ×8 IMPLANT
GLOVE BIOGEL PI IND STRL 6.5 (GLOVE) ×1 IMPLANT
GLOVE BIOGEL PI IND STRL 7.5 (GLOVE) ×1 IMPLANT
GLOVE BIOGEL PI INDICATOR 6.5 (GLOVE) ×1
GLOVE BIOGEL PI INDICATOR 7.5 (GLOVE) ×1
GLOVE PROGUARD SZ 7 1/2 (GLOVE) ×2 IMPLANT
GOWN STRL REUS W/ TWL LRG LVL3 (GOWN DISPOSABLE) ×2 IMPLANT
GOWN STRL REUS W/TWL LRG LVL3 (GOWN DISPOSABLE) ×2
HANDPIECE INTERPULSE COAX TIP (DISPOSABLE) ×1
K-WIRE ACE 1.6X6 (WIRE) ×6
KIT BASIN OR (CUSTOM PROCEDURE TRAY) ×2 IMPLANT
KIT TURNOVER KIT B (KITS) ×2 IMPLANT
KWIRE ACE 1.6X6 (WIRE) IMPLANT
MANIFOLD NEPTUNE II (INSTRUMENTS) ×2 IMPLANT
NAIL FLEXIBLE WIN 2.5MM (Nail) ×1 IMPLANT
NS IRRIG 1000ML POUR BTL (IV SOLUTION) ×2 IMPLANT
PACK TOTAL JOINT (CUSTOM PROCEDURE TRAY) ×2 IMPLANT
PAD ABD 7.5X8 STRL (GAUZE/BANDAGES/DRESSINGS) ×1 IMPLANT
PAD ARMBOARD 7.5X6 YLW CONV (MISCELLANEOUS) ×4 IMPLANT
PADDING CAST COTTON 6X4 STRL (CAST SUPPLIES) ×2 IMPLANT
PLATE 9H RT DIST ANTLAT TIB (Plate) ×1 IMPLANT
PLATE ANTLAT CNTR NAR 156X9 (Plate) IMPLANT
SCREW CORTICAL 3.5MM 24MM (Screw) ×2 IMPLANT
SCREW CORTICAL 3.5MM 26MM (Screw) ×2 IMPLANT
SCREW CORTICAL 3.5MM 36MM (Screw) ×1 IMPLANT
SCREW LOCK CORT STAR 3.5X34 (Screw) ×1 IMPLANT
SCREW LOCK CORT STAR 3.5X40 (Screw) ×2 IMPLANT
SCREW LOCK CORT STAR 3.5X42 (Screw) ×1 IMPLANT
SCREW LOCK CORT STAR 3.5X44 (Screw) ×1 IMPLANT
SET HNDPC FAN SPRY TIP SCT (DISPOSABLE) IMPLANT
STOCKINETTE IMPERVIOUS LG (DRAPES) IMPLANT
SUCTION FRAZIER HANDLE 10FR (MISCELLANEOUS) ×1
SUCTION TUBE FRAZIER 10FR DISP (MISCELLANEOUS) ×1 IMPLANT
SUT ETHILON 3 0 PS 1 (SUTURE) ×3 IMPLANT
SUT MNCRL AB 3-0 PS2 18 (SUTURE) ×2 IMPLANT
SUT MON AB 2-0 CT1 36 (SUTURE) ×1 IMPLANT
SUT PDS AB 0 CT 36 (SUTURE) ×1 IMPLANT

## 2018-06-06 NOTE — Anesthesia Postprocedure Evaluation (Signed)
Anesthesia Post Note  Patient: Judy Hernandez  Procedure(s) Performed: OPEN REDUCTION INTERNAL FIXATION (ORIF) TIBIA/FIBULA FRACTURE (Left Leg Lower)     Patient location during evaluation: PACU Anesthesia Type: General Level of consciousness: awake and alert Pain management: pain level controlled Vital Signs Assessment: post-procedure vital signs reviewed and stable Respiratory status: spontaneous breathing, nonlabored ventilation, respiratory function stable and patient connected to nasal cannula oxygen Cardiovascular status: blood pressure returned to baseline and stable Postop Assessment: no apparent nausea or vomiting Anesthetic complications: no    Last Vitals:  Vitals:   06/06/18 0000 06/06/18 0400  BP: 111/82 106/83  Pulse:    Resp: 13   Temp: 37 C 37 C  SpO2: 90%     Last Pain:  Vitals:   06/06/18 0400  TempSrc: Oral  PainSc:                  Kennieth RadFitzgerald, Carlous Olivares E

## 2018-06-06 NOTE — Progress Notes (Signed)
06/06/2018 11:15pm Noted upon assessment right toes cool to touch, cap refill less than 3 seconds. Able to move toes, 2plus politeal plus and 1plus pedal pulse. Note left toes warm to touch, able to move toes and 2plus popliteal pulse. Reassessed by Cristie Hem, RN. Same finding found.  Page to Trauma service Dr. Andrey Campanile notes to call ortho service. Page to ortho service. Awaiting call back.  Olevia Perches, BSN
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
## 2018-06-06 NOTE — Anesthesia Postprocedure Evaluation (Signed)
Anesthesia Post Note  Patient: Lilyani Labra  Procedure(s) Performed: OPEN REDUCTION INTERNAL FIXATION (ORIF) RIGHT PILON FRACTURE (Right )     Patient location during evaluation: PACU Anesthesia Type: General Level of consciousness: awake and alert Pain management: pain level controlled Vital Signs Assessment: post-procedure vital signs reviewed and stable Respiratory status: spontaneous breathing, nonlabored ventilation and respiratory function stable Cardiovascular status: blood pressure returned to baseline and stable Postop Assessment: no apparent nausea or vomiting Anesthetic complications: no    Last Vitals:  Vitals:   06/06/18 1355 06/06/18 1417  BP: 125/78 118/83  Pulse: 71 75  Resp: 12 15  Temp: 36.6 C 36.7 C  SpO2: 92% 97%    Last Pain:  Vitals:   06/06/18 1435  TempSrc:   PainSc: 9                  Kaylyn Layer

## 2018-06-06 NOTE — Progress Notes (Signed)
Spoke to Ortho on call PA Josh, reports to continue monitoring patient right lower extremity and he will assess in the morning. Order received for pepcid as requested by patient for acid reflux.  Judy Hernandez
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
## 2018-06-06 NOTE — Anesthesia Procedure Notes (Signed)
Procedure Name: LMA Insertion Date/Time: 06/06/2018 9:54 AM Performed by: Mayer CamelBarr, Dalyn Becker S, CRNA Pre-anesthesia Checklist: Patient identified, Emergency Drugs available, Suction available and Patient being monitored Patient Re-evaluated:Patient Re-evaluated prior to induction Oxygen Delivery Method: Circle System Utilized Preoxygenation: Pre-oxygenation with 100% oxygen Induction Type: IV induction Ventilation: Mask ventilation without difficulty LMA: LMA inserted LMA Size: 4.0 Number of attempts: 1 Airway Equipment and Method: Bite block Placement Confirmation: positive ETCO2 Tube secured with: Tape Dental Injury: Teeth and Oropharynx as per pre-operative assessment

## 2018-06-06 NOTE — Transfer of Care (Signed)
Immediate Anesthesia Transfer of Care Note  Patient: Judy Hernandez  Procedure(s) Performed: OPEN REDUCTION INTERNAL FIXATION (ORIF) RIGHT PILON FRACTURE (Right )  Patient Location: PACU  Anesthesia Type:General  Level of Consciousness: awake and oriented , alert Airway & Oxygen Therapy: Patient Spontanous Breathing and Patient connected to nasal cannula oxygen  Post-op Assessment: Report given to RN and Post -op Vital signs reviewed and stable  Post vital signs: Reviewed and stable  Last Vitals:  Vitals Value Taken Time  BP 116/70 06/06/2018  1:11 PM  Temp 36.5 C 06/06/2018  1:12 PM  Pulse 96 06/06/2018  1:13 PM  Resp 20 06/06/2018  1:13 PM  SpO2 89 % 06/06/2018  1:13 PM  Vitals shown include unvalidated device data.  Last Pain:  Vitals:   06/06/18 1312  TempSrc:   PainSc: 0-No pain      Patients Stated Pain Goal: 2 (06/05/18 1930)  Complications: No apparent anesthesia complications

## 2018-06-07 LAB — CBC
HCT: 27.5 % — ABNORMAL LOW (ref 36.0–46.0)
Hemoglobin: 8.6 g/dL — ABNORMAL LOW (ref 12.0–15.0)
MCH: 31.9 pg (ref 26.0–34.0)
MCHC: 31.3 g/dL (ref 30.0–36.0)
MCV: 101.9 fL — ABNORMAL HIGH (ref 80.0–100.0)
NRBC: 0.2 % (ref 0.0–0.2)
Platelets: 308 10*3/uL (ref 150–400)
RBC: 2.7 MIL/uL — ABNORMAL LOW (ref 3.87–5.11)
RDW: 13.5 % (ref 11.5–15.5)
WBC: 9.6 10*3/uL (ref 4.0–10.5)

## 2018-06-07 LAB — CREATININE, SERUM
Creatinine, Ser: 0.68 mg/dL (ref 0.44–1.00)
GFR calc Af Amer: >60 mL/min (ref >60)
GFR calc non Af Amer: >60 mL/min (ref >60)

## 2018-06-07 MED ORDER — CITALOPRAM HYDROBROMIDE 10 MG PO TABS
10.0000 mg | ORAL_TABLET | Freq: Every day | ORAL | Status: DC
  Administered 2018-06-07 – 2018-06-12 (×6): 10 mg via ORAL
  Filled 2018-06-07 (×6): qty 1
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
## 2018-06-07 NOTE — Progress Notes (Signed)
Noted when repositioning patient that tele monitor reading V tach. Possible artifact from repositioning as monitor reading NSR when no longer moving in bed.  Olevia Perches, BSN

## 2018-06-07 NOTE — Progress Notes (Signed)
Subjective: Patient reports sore in back  Objective: Vital signs in last 24 hours: Temp:  [97.7 F (36.5 C)-98.5 F (36.9 C)] 98.3 F (36.8 C) (01/04 0700) Pulse Rate:  [64-93] 66 (01/04 0700) Resp:  [12-22] 16 (01/04 0700) BP: (100-126)/(60-83) 117/70 (01/04 0700) SpO2:  [92 %-98 %] 98 % (01/04 0700)  Intake/Output from previous day: 01/03 0701 - 01/04 0700 In: 1300 [I.V.:1300] Out: 3150 [Urine:3100; Blood:50] Intake/Output this shift: No intake/output data recorded.  Physical Exam: NWB with bilateral ankle fxs.  Using ice for back.  MAEW.  Lab Results: Recent Labs    06/06/18 0348 06/07/18 0454  WBC 8.0 9.6  HGB 9.4* 8.6*  HCT 29.3* 27.5*  PLT 254 308   BMET Recent Labs    06/05/18 1206 06/06/18 0348 06/07/18 0454  NA 140 138  --   K 4.4 4.6  --   CL 104 102  --   CO2 28 29  --   GLUCOSE 104* 119*  --   BUN 7 8  --   CREATININE 0.57 0.65 0.68  CALCIUM 8.5* 9.0  --     Studies/Results: Dg Tibia/fibula Left  Result Date: 06/05/2018 CLINICAL DATA:  Fractures of the left tibia and fibula. EXAM: LEFT TIBIA AND FIBULA - 2 VIEW; DG C-ARM 61-120 MIN COMPARISON:  Radiographs dated 06/02/2018 and 05/31/2018 FINDINGS: Multiple C-arm images demonstrate the patient undergoing open reduction and internal fixation of the comminuted fractures of the distal left tibia. Alignment and position of the fracture fragments is markedly improved. There is also slight improvement in the alignment and angulation of the fracture of the distal fibula. IMPRESSION: Open reduction and internal fixation of distal tibia fracture. FLUOROSCOPY TIME:  C-arm fluoroscopic images were obtained intraoperatively and submitted for post operative interpretation. Please see the performing provider's procedural report for the fluoroscopy time utilized. Electronically Signed   By: Francene Boyers M.D.   On: 06/05/2018 17:13   Dg Ankle Complete Right  Result Date: 06/06/2018 CLINICAL DATA:  ORIF right ankle.  EXAM: DG C-ARM 61-120 MIN; RIGHT ANKLE - COMPLETE 3+ VIEW COMPARISON:  06/02/2018. FINDINGS: Comminuted fractures are again noted the distal tibia and fibula. ORIF of these of fractures noted. Hardware intact. Near anatomic alignment. IMPRESSION: ORIF distal tibia and fibular fractures. Electronically Signed   By: Maisie Fus  Register   On: 06/06/2018 12:45   Dg Tibia/fibula Left Port  Result Date: 06/05/2018 CLINICAL DATA:  Status post internal fixation of tibial fracture. EXAM: PORTABLE LEFT TIBIA AND FIBULA - 2 VIEW COMPARISON:  Intraoperative images performed earlier today at 3:59 p.m. FINDINGS: The patient is status post internal fixation of the tibial fractures with a plate and screws, transfixing the fractures in near anatomic alignment. Mildly displaced proximal and distal comminuted fibular fractures are again noted. No new fractures are seen. Evaluation of the soft tissues is mildly suboptimal due to the overlying splint. No knee joint effusion is identified. IMPRESSION: 1. Status post internal fixation of tibial fractures in near anatomic alignment. 2. Mildly displaced proximal and distal comminuted fibular fractures again noted. Electronically Signed   By: Roanna Raider M.D.   On: 06/05/2018 23:00   Dg Tibia/fibula Right Port  Result Date: 06/06/2018 CLINICAL DATA:  Fracture. EXAM: PORTABLE RIGHT TIBIA AND FIBULA - 2 VIEW COMPARISON:  Fluoroscopic images of same day. FINDINGS: The right lower leg has been splinted and immobilized. Status post intramedullary rod fixation of comminuted distal right fibular fracture. Also noted is status post surgical internal fixation of  comminuted distal right tibial fracture. Improved alignment of fracture components is noted. IMPRESSION: Status post open reduction and internal fixation of distal right fibular and tibial fractures. Electronically Signed   By: Lupita Raider, M.D.   On: 06/06/2018 14:56   Dg C-arm 1-60 Min  Result Date: 06/06/2018 CLINICAL DATA:   ORIF right ankle. EXAM: DG C-ARM 61-120 MIN; RIGHT ANKLE - COMPLETE 3+ VIEW COMPARISON:  06/02/2018. FINDINGS: Comminuted fractures are again noted the distal tibia and fibula. ORIF of these of fractures noted. Hardware intact. Near anatomic alignment. IMPRESSION: ORIF distal tibia and fibular fractures. Electronically Signed   By: Maisie Fus  Register   On: 06/06/2018 12:45   Dg C-arm 1-60 Min  Result Date: 06/05/2018 CLINICAL DATA:  Fractures of the left tibia and fibula. EXAM: LEFT TIBIA AND FIBULA - 2 VIEW; DG C-ARM 61-120 MIN COMPARISON:  Radiographs dated 06/02/2018 and 05/31/2018 FINDINGS: Multiple C-arm images demonstrate the patient undergoing open reduction and internal fixation of the comminuted fractures of the distal left tibia. Alignment and position of the fracture fragments is markedly improved. There is also slight improvement in the alignment and angulation of the fracture of the distal fibula. IMPRESSION: Open reduction and internal fixation of distal tibia fracture. FLUOROSCOPY TIME:  C-arm fluoroscopic images were obtained intraoperatively and submitted for post operative interpretation. Please see the performing provider's procedural report for the fluoroscopy time utilized. Electronically Signed   By: Francene Boyers M.D.   On: 06/05/2018 17:13    Assessment/Plan: Mobilize per PT and Ortho.  Will restart celexa.    LOS: 7 days    Dorian Heckle, MD 06/07/2018, 9:19 AM

## 2018-06-07 NOTE — Progress Notes (Signed)
Subjective: 1 Day Post-Op Procedure(s) (LRB): OPEN REDUCTION INTERNAL FIXATION (ORIF) RIGHT PILON FRACTURE (Right) Patient reports pain as moderate.    Objective: Vital signs in last 24 hours: Temp:  [97.7 F (36.5 C)-98.5 F (36.9 C)] 98.3 F (36.8 C) (01/04 0700) Pulse Rate:  [64-93] 66 (01/04 0700) Resp:  [12-22] 16 (01/04 0700) BP: (100-126)/(60-83) 117/70 (01/04 0700) SpO2:  [92 %-98 %] 98 % (01/04 0700)  Intake/Output from previous day: 01/03 0701 - 01/04 0700 In: 1300 [I.V.:1300] Out: 3150 [Urine:3100; Blood:50] Intake/Output this shift: No intake/output data recorded.  Recent Labs    06/05/18 1206 06/06/18 0348 06/07/18 0454  HGB 9.4* 9.4* 8.6*   Recent Labs    06/06/18 0348 06/07/18 0454  WBC 8.0 9.6  RBC 2.97* 2.70*  HCT 29.3* 27.5*  PLT 254 308   Recent Labs    06/05/18 1206 06/06/18 0348 06/07/18 0454  NA 140 138  --   K 4.4 4.6  --   CL 104 102  --   CO2 28 29  --   BUN 7 8  --   CREATININE 0.57 0.65 0.68  GLUCOSE 104* 119*  --   CALCIUM 8.5* 9.0  --    No results for input(s): LABPT, INR in the last 72 hours.  Neurovascular intact Sensation intact distally Intact pulses distally Incision: dressing C/D/I Compartment soft No coolness to toes, good cap refill, and pedal pulses  Assessment/Plan: 1 Day Post-Op Procedure(s) (LRB): OPEN REDUCTION INTERNAL FIXATION (ORIF) RIGHT PILON FRACTURE (Right)  S/p ORIF L open segmental tibial shaft and pilon fracture   NWB Bilat LE Will consult PT/OT to help determine dispo needs Pain control as ordered  Restart celexa Will cont to follow     Margart Sickles 06/07/2018, 9:28 AM

## 2018-06-08 DIAGNOSIS — S2249XA Multiple fractures of ribs, unspecified side, initial encounter for closed fracture: Secondary | ICD-10-CM

## 2018-06-08 DIAGNOSIS — S2220XA Unspecified fracture of sternum, initial encounter for closed fracture: Secondary | ICD-10-CM

## 2018-06-08 DIAGNOSIS — S82872C Displaced pilon fracture of left tibia, initial encounter for open fracture type IIIA, IIIB, or IIIC: Secondary | ICD-10-CM

## 2018-06-08 DIAGNOSIS — S82871C Displaced pilon fracture of right tibia, initial encounter for open fracture type IIIA, IIIB, or IIIC: Secondary | ICD-10-CM

## 2018-06-08 DIAGNOSIS — S32030A Wedge compression fracture of third lumbar vertebra, initial encounter for closed fracture: Secondary | ICD-10-CM

## 2018-06-08 MED ORDER — OXYCODONE HCL 5 MG PO TABS
10.0000 mg | ORAL_TABLET | ORAL | Status: DC | PRN
  Administered 2018-06-08 – 2018-06-09 (×4): 10 mg via ORAL
  Filled 2018-06-08 (×4): qty 2

## 2018-06-08 MED ORDER — POLYETHYLENE GLYCOL 3350 17 G PO PACK
17.0000 g | PACK | Freq: Every day | ORAL | Status: DC
  Administered 2018-06-08 – 2018-06-10 (×3): 17 g via ORAL
  Filled 2018-06-08 (×4): qty 1
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
## 2018-06-08 NOTE — Progress Notes (Signed)
Subjective: Patient reports sore all over  Objective: Vital signs in last 24 hours: Temp:  [98.1 F (36.7 C)-98.8 F (37.1 C)] 98.7 F (37.1 C) (01/05 0751) Pulse Rate:  [51-87] 64 (01/05 0300) Resp:  [11-25] 25 (01/05 0300) BP: (87-128)/(61-91) 100/61 (01/05 0300) SpO2:  [76 %-100 %] 96 % (01/05 0300)  Intake/Output from previous day: 01/04 0701 - 01/05 0700 In: 500 [P.O.:500] Out: 3200 [Urine:3200] Intake/Output this shift: No intake/output data recorded.  Physical Exam: MAEW.  On bedrest.   Lab Results: Recent Labs    06/06/18 0348 06/07/18 0454  WBC 8.0 9.6  HGB 9.4* 8.6*  HCT 29.3* 27.5*  PLT 254 308   BMET Recent Labs    06/05/18 1206 06/06/18 0348 06/07/18 0454  NA 140 138  --   K 4.4 4.6  --   CL 104 102  --   CO2 28 29  --   GLUCOSE 104* 119*  --   BUN 7 8  --   CREATININE 0.57 0.65 0.68  CALCIUM 8.5* 9.0  --     Studies/Results: Dg Ankle Complete Right  Result Date: 06/06/2018 CLINICAL DATA:  ORIF right ankle. EXAM: DG C-ARM 61-120 MIN; RIGHT ANKLE - COMPLETE 3+ VIEW COMPARISON:  06/02/2018. FINDINGS: Comminuted fractures are again noted the distal tibia and fibula. ORIF of these of fractures noted. Hardware intact. Near anatomic alignment. IMPRESSION: ORIF distal tibia and fibular fractures. Electronically Signed   By: Maisie Fus  Register   On: 06/06/2018 12:45   Dg Tibia/fibula Right Port  Result Date: 06/06/2018 CLINICAL DATA:  Fracture. EXAM: PORTABLE RIGHT TIBIA AND FIBULA - 2 VIEW COMPARISON:  Fluoroscopic images of same day. FINDINGS: The right lower leg has been splinted and immobilized. Status post intramedullary rod fixation of comminuted distal right fibular fracture. Also noted is status post surgical internal fixation of comminuted distal right tibial fracture. Improved alignment of fracture components is noted. IMPRESSION: Status post open reduction and internal fixation of distal right fibular and tibial fractures. Electronically Signed    By: Lupita Raider, M.D.   On: 06/06/2018 14:56   Dg C-arm 1-60 Min  Result Date: 06/06/2018 CLINICAL DATA:  ORIF right ankle. EXAM: DG C-ARM 61-120 MIN; RIGHT ANKLE - COMPLETE 3+ VIEW COMPARISON:  06/02/2018. FINDINGS: Comminuted fractures are again noted the distal tibia and fibula. ORIF of these of fractures noted. Hardware intact. Near anatomic alignment. IMPRESSION: ORIF distal tibia and fibular fractures. Electronically Signed   By: Maisie Fus  Register   On: 06/06/2018 12:45    Assessment/Plan: Mobilize with PT per Ortho restrictions.  Dr. Newell Coral to follow.  Celexa restarted.    LOS: 8 days    Dorian Heckle, MD 06/08/2018, 8:24 AM

## 2018-06-08 NOTE — Progress Notes (Signed)
Subjective: 2 Days Post-Op Procedure(s) (LRB): OPEN REDUCTION INTERNAL FIXATION (ORIF) RIGHT PILON FRACTURE (Right) Patient reports pain as moderate.    Objective: Vital signs in last 24 hours: Temp:  [98.1 F (36.7 C)-98.8 F (37.1 C)] 98.7 F (37.1 C) (01/05 0751) Pulse Rate:  [51-87] 58 (01/05 0851) Resp:  [11-25] 16 (01/05 0900) BP: (60-128)/(31-91) 99/68 (01/05 0900) SpO2:  [76 %-100 %] 96 % (01/05 0300)  Intake/Output from previous day: 01/04 0701 - 01/05 0700 In: 500 [P.O.:500] Out: 3200 [Urine:3200] Intake/Output this shift: No intake/output data recorded.  Recent Labs    06/05/18 1206 06/06/18 0348 06/07/18 0454  HGB 9.4* 9.4* 8.6*   Recent Labs    06/06/18 0348 06/07/18 0454  WBC 8.0 9.6  RBC 2.97* 2.70*  HCT 29.3* 27.5*  PLT 254 308   Recent Labs    06/05/18 1206 06/06/18 0348 06/07/18 0454  NA 140 138  --   K 4.4 4.6  --   CL 104 102  --   CO2 28 29  --   BUN 7 8  --   CREATININE 0.57 0.65 0.68  GLUCOSE 104* 119*  --   CALCIUM 8.5* 9.0  --    No results for input(s): LABPT, INR in the last 72 hours.  Pt resting comfortably    Assessment/Plan: 2 Days Post-Op Procedure(s) (LRB): OPEN REDUCTION INTERNAL FIXATION (ORIF) RIGHT PILON FRACTURE (Right) Up with therapy PT/OT Pain control as ordered nwb bilat LE lovenox dvt proph Will cont to follow     Margart Sickles 06/08/2018, 11:21 AM

## 2018-06-09 ENCOUNTER — Inpatient Hospital Stay (HOSPITAL_COMMUNITY): Payer: Medicaid - Out of State

## 2018-06-09 ENCOUNTER — Encounter (HOSPITAL_COMMUNITY): Payer: Self-pay | Admitting: Student

## 2018-06-09 LAB — CBC
HCT: 34.3 % — ABNORMAL LOW (ref 36.0–46.0)
Hemoglobin: 10.7 g/dL — ABNORMAL LOW (ref 12.0–15.0)
MCH: 31.7 pg (ref 26.0–34.0)
MCHC: 31.2 g/dL (ref 30.0–36.0)
MCV: 101.5 fL — ABNORMAL HIGH (ref 80.0–100.0)
Platelets: 436 10*3/uL — ABNORMAL HIGH (ref 150–400)
RBC: 3.38 MIL/uL — ABNORMAL LOW (ref 3.87–5.11)
RDW: 13.4 % (ref 11.5–15.5)
WBC: 10 10*3/uL (ref 4.0–10.5)
nRBC: 0 % (ref 0.0–0.2)

## 2018-06-09 LAB — BASIC METABOLIC PANEL
Anion gap: 7 (ref 5–15)
BUN: 9 mg/dL (ref 6–20)
CO2: 26 mmol/L (ref 22–32)
Calcium: 9.7 mg/dL (ref 8.9–10.3)
Chloride: 101 mmol/L (ref 98–111)
Creatinine, Ser: 0.66 mg/dL (ref 0.44–1.00)
GFR calc Af Amer: >60 mL/min (ref >60)
GFR calc non Af Amer: >60 mL/min (ref >60)
Glucose, Bld: 117 mg/dL — ABNORMAL HIGH (ref 70–99)
Potassium: 4.7 mmol/L (ref 3.5–5.1)
Sodium: 134 mmol/L — ABNORMAL LOW (ref 135–145)

## 2018-06-09 MED ORDER — BISACODYL 10 MG RE SUPP: 10.0000 mg | Freq: Every day | RECTAL | Status: DC | PRN

## 2018-06-09 MED ORDER — METHOCARBAMOL 500 MG PO TABS
750.0000 mg | ORAL_TABLET | Freq: Three times a day (TID) | ORAL | Status: DC
  Administered 2018-06-09 – 2018-06-12 (×10): 750 mg via ORAL
  Filled 2018-06-09 (×10): qty 2

## 2018-06-09 MED ORDER — OXYCODONE HCL 5 MG PO TABS
5.0000 mg | ORAL_TABLET | ORAL | Status: DC | PRN
  Administered 2018-06-09 – 2018-06-12 (×16): 15 mg via ORAL
  Filled 2018-06-09 (×17): qty 3
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
## 2018-06-09 NOTE — Plan of Care (Signed)
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
## 2018-06-09 NOTE — Progress Notes (Signed)
Patient will return home with home health once medically stable.   CSW signing off.   Drucilla Schmidt, MSW, LCSW-A Clinical Social Worker [employer]
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
## 2018-06-10 ENCOUNTER — Encounter (HOSPITAL_COMMUNITY): Payer: Self-pay | Admitting: General Practice
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
## 2018-06-10 NOTE — Progress Notes (Signed)
OT Cancellation Note  Patient Details Name: Judy Hernandez MRN: [id number] DOB: 1966-01-27   Cancelled Treatment:    Reason Eval/Treat Not Completed: Other (comment), patient declined session.  Reports "I'm just upset now because I can't be there for my daughter.  I'm frustrated that I have to use the bed pan all the time".  Therapist educated patient on role of OT, and use of 3:1 for toileting needs; patient declines session right now, but reports will attempt later today.  Will return as able.    Chancy Milroy, OT Acute Rehabilitation Services Pager [phone] Office [phone]    Chancy Milroy 06/10/2018, 1:33 PM

## 2018-06-10 NOTE — Progress Notes (Signed)
Orthopedic Tech Progress Note Patient Details:  Judy Hernandez Jan 23, 1966 [id number]  Ortho Devices Type of Ortho Device: CAM walker Ortho Device/Splint Location: Bilateral cam walkers Ortho Device/Splint Interventions: Application   Post Interventions Patient Tolerated: Well Instructions Provided: Care of device   Saul Fordyce 06/10/2018, 4:51 PM
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
## 2018-06-10 NOTE — Care Management (Addendum)
Gave Judy Hernandez with [employer] [phone] update.     Spoke to patient at bedside. Explained Judy Hernandez at [facility] is trying to reach daughter Judy Hernandez to arrange delivery of DME.   Patient video called Judy Hernandez through [employer]. Judy Hernandez is at home, however, "chris" took her phone to work. NCM provided Judy Hernandez with [employer] number, however, she does not have a phone to call [facility]. Patient is calling a friend, to see if she ( the friend) can go to [employer] and allow Judy Hernandez to use her phone to call [employer].  Ronny Flurry RN BSN [phone]

## 2018-06-10 NOTE — Progress Notes (Signed)
Subjective: Patient complaining of some sternal discomfort, but says that her back is actually feeling better this morning.  CT of the lumbar spine done last night and compared to lumbar spine's appearance on admission CT of the abdomen and pelvis.  Last night CT shows further loss of vertebral body height, with mild increased narrowing of the spinal canal.  Objective: Vital signs in last 24 hours: Vitals:   06/09/18 1640 06/09/18 2053 06/09/18 2300 06/10/18 0400  BP: 122/77 118/68 120/72 125/81  Pulse: 64 66 65   Resp:  18 17 16   Temp: 98.8 F (37.1 C) 98.6 F (37 C) 98.7 F (37.1 C) 98.4 F (36.9 C)  TempSrc: Oral Oral Oral Oral  SpO2: 97%  98% 99%  Weight:      Height:        Intake/Output from previous day: 01/06 0701 - 01/07 0700 In: -  Out: 375 [Urine:375] Intake/Output this shift: No intake/output data recorded.  Physical Exam: Exam of lower extremities limited by distal lower extremity splints, but moving lower extremities symmetrically as feasible.  CBC Recent Labs    06/09/18 0609  WBC 10.0  HGB 10.7*  HCT 34.3*  PLT 436*   BMET Recent Labs    06/09/18 0353  NA 134*  K 4.7  CL 101  CO2 26  GLUCOSE 117*  BUN 9  CREATININE 0.66  CALCIUM 9.7    Studies/Results: Ct Lumbar Spine Wo Contrast  Result Date: 06/09/2018 CLINICAL DATA:  Lumbar spine fracture EXAM: CT LUMBAR SPINE WITHOUT CONTRAST TECHNIQUE: Multidetector CT imaging of the lumbar spine was performed without intravenous contrast administration. Multiplanar CT image reconstructions were also generated. COMPARISON:  CT abdomen pelvis 05/30/2018 FINDINGS: Segmentation: 5 lumbar type vertebrae. Alignment: Normal. Vertebrae: There has been slight progression of height loss at the L3 level. At the midline, the minimum vertebral body height now measures 10 mm, previously 13 mm. Retropulsion of the posterosuperior corner now measures 6 mm, previously 5 mm. Posterior elements are normal. No other fracture.  Paraspinal and other soft tissues: Decreased size of prevertebral hematoma/edema. Mild calcific aortic atherosclerosis. Disc levels: There is moderate spinal canal stenosis at the L3 level secondary to retropulsion at the fracture site. This has worsened since the prior study and now measures 7 mm at the midline, compared to 11 mm previously. Otherwise, there is no spinal canal or neural foraminal stenosis of the lumbar spine. Bilateral pars interarticularis defects of L5 are unchanged. IMPRESSION: 1. Slight progression of height loss and retropulsion at the fractured L3 vertebra. Worsening spinal canal stenosis, now moderate. 2. Moderate spinal canal stenosis at the L3 level secondary to retropulsion at the fracture site. 3. Unchanged bilateral L5 pars interarticularis defects without listhesis. Electronically Signed   By: Deatra Robinson M.D.   On: 06/09/2018 20:45    Assessment/Plan: Discussed CT findings with patient.  Discussed the alternatives of continued nonsurgical management with TLSO bracing versus surgical invention which would require a long fusion from L1-S1.  She strongly prefers continued nonsurgical management.  This will require the continued use of the TLSO for at least 3 more months, wearing it when in bed, if the head of bed is above 30 degrees and wearing it when out of bed (to be donned and doffed in bed).  She acknowledges and understands this instruction.  I did have an opportunity discussed my assessment and recommendations as well with Dr. Violeta Gelinas from the trauma surgical service and the trauma surgery physician assistants.  I  will need to see the patient back in the office in about 2 months with a repeat CT of the lumbar spine (the week of or the day of the appointment).  Hewitt Shorts, MD 06/10/2018, 9:23 AM

## 2018-06-11 MED ORDER — TRAMADOL HCL 50 MG PO TABS
100.0000 mg | ORAL_TABLET | Freq: Four times a day (QID) | ORAL | Status: DC
  Administered 2018-06-11 – 2018-06-12 (×4): 100 mg via ORAL
  Filled 2018-06-11 (×4): qty 2

## 2018-06-11 NOTE — Progress Notes (Signed)
Vitals:   06/10/18 1948 06/11/18 0000 06/11/18 0400 06/11/18 0844  BP: 136/72 134/73 111/79 110/73  Pulse: 70   63  Resp: 18     Temp: 98.9 F (37.2 C) 98.6 F (37 C) 98.9 F (37.2 C) 98.5 F (36.9 C)  TempSrc: Oral Oral Oral Oral  SpO2: 100% 100% 100% 99%  Weight:      Height:        CBC Recent Labs    06/09/18 0609  WBC 10.0  HGB 10.7*  HCT 34.3*  PLT 436*   BMET Recent Labs    06/09/18 0353  NA 134*  K 4.7  CL 101  CO2 26  GLUCOSE 117*  BUN 9  CREATININE 0.66  CALCIUM 9.7    Patient resting comfortably in bed.  Continues on nonweightbearing basis.  Understands that she needs to be in the TLSO if the head of bed is 30 degrees or higher and when out of bed.  She also understands that she is to don and doff the TLSO in bed.  Strength 5/5 in the iliopsoas and quadriceps bilaterally.  Testing of dorsiflexor limited by pain, but plantarflexion is at least 4/5 bilaterally.  Plan: Stable from a neurosurgical perspective.  Patient to follow-up with me in the office in 2 months with a CT of the lumbar spine [facility]  She understand that she will need to continue the TLSO for at least 3 months.  Hewitt ShortsNUDELMAN,ROBERT W, MD 06/11/2018, 10:46 AM
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
## 2018-06-12 MED ORDER — VARENICLINE TARTRATE 1 MG PO TABS: 1.0000 mg | ORAL_TABLET | Freq: Two times a day (BID) | ORAL | 0 refills | Status: DC

## 2018-06-12 MED ORDER — OXYCODONE HCL 10 MG PO TABS: 5.0000 mg | ORAL_TABLET | Freq: Four times a day (QID) | ORAL | 0 refills | Status: DC | PRN

## 2018-06-12 MED ORDER — METHOCARBAMOL 750 MG PO TABS: 750.0000 mg | ORAL_TABLET | Freq: Three times a day (TID) | ORAL | 1 refills | Status: DC

## 2018-06-12 MED ORDER — CITALOPRAM HYDROBROMIDE 10 MG PO TABS: 10.0000 mg | ORAL_TABLET | Freq: Every day | ORAL | 0 refills | Status: DC

## 2018-06-12 MED ORDER — BISACODYL 10 MG RE SUPP: 10.0000 mg | Freq: Every day | RECTAL | 0 refills | Status: DC | PRN

## 2018-06-12 MED ORDER — TRAMADOL HCL 50 MG PO TABS: 100.0000 mg | ORAL_TABLET | Freq: Four times a day (QID) | ORAL | 0 refills | Status: DC

## 2018-06-12 MED ORDER — GABAPENTIN 300 MG PO CAPS: 300.0000 mg | ORAL_CAPSULE | Freq: Three times a day (TID) | ORAL | 1 refills | Status: DC

## 2018-06-12 MED ORDER — DOCUSATE SODIUM 100 MG PO CAPS: 100.0000 mg | ORAL_CAPSULE | Freq: Two times a day (BID) | ORAL | 0 refills | Status: DC

## 2018-06-12 MED ORDER — FAMOTIDINE 10 MG PO TABS: 10.0000 mg | ORAL_TABLET | Freq: Every day | ORAL | Status: DC

## 2018-06-12 MED ORDER — TRAMADOL HCL 50 MG PO TABS: 100.0000 mg | ORAL_TABLET | Freq: Four times a day (QID) | ORAL | 0 refills | Status: AC

## 2018-06-12 MED ORDER — POLYETHYLENE GLYCOL 3350 17 G PO PACK: 17.0000 g | PACK | Freq: Every day | ORAL | 0 refills | Status: DC

## 2018-06-12 MED ORDER — ENOXAPARIN SODIUM 40 MG/0.4ML ~~LOC~~ SOLN: 40.0000 mg | SUBCUTANEOUS | 0 refills | Status: DC

## 2018-06-12 MED ORDER — ACETAMINOPHEN 500 MG PO TABS: 1000.0000 mg | ORAL_TABLET | Freq: Three times a day (TID) | ORAL | 0 refills | Status: DC | PRN
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
## 2018-06-12 NOTE — Social Work (Signed)
PTAR scheduled for noon pick up at request of RN staff. Pt discharging to [address] , [place] [address]  CSW signing off. Please consult if any additional needs arise.  Doy Hutching, LCSWA [facility] Clinical Social Work [phone]
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
## 2018-06-12 NOTE — Care Management (Signed)
Melissa at [facility] aware patient discharging today. Discharge summary faxed to [facility] at [phone].  Ronny Flurry RN

## 2018-06-12 NOTE — Discharge Instructions (Signed)
Enoxaparin injection What is this medicine? ENOXAPARIN (ee nox a PA rin) is used after knee, hip, or abdominal surgeries to prevent blood clotting. It is also used to treat existing blood clots in the lungs or in the veins. This medicine may be used for other purposes; ask your health care provider or pharmacist if you have questions. COMMON BRAND NAME(S): Lovenox What should I tell my health care provider before I take this medicine? They need to know if you have any of these conditions: -bleeding disorders, hemorrhage, or hemophilia -infection of the heart or heart valves -kidney or liver disease -previous stroke -prosthetic heart valve -recent surgery or delivery of a baby -ulcer in the stomach or intestine, diverticulitis, or other bowel disease -an unusual or allergic reaction to enoxaparin, heparin, pork or pork products, other medicines, foods, dyes, or preservatives -pregnant or trying to get pregnant -breast-feeding How should I use this medicine? This medicine is for injection under the skin. It is usually given by a health-care professional. You or a family member may be trained on how to give the injections. If you are to give yourself injections, make sure you understand how to use the syringe, measure the dose if necessary, and give the injection. To avoid bruising, do not rub the site where this medicine has been injected. Do not take your medicine more often than directed. Do not stop taking except on the advice of your doctor or health care professional. Make sure you receive a puncture-resistant container to dispose of the needles and syringes once you have finished with them. Do not reuse these items. Return the container to your doctor or health care professional for proper disposal. Talk to your pediatrician regarding the use of this medicine in children. Special care may be needed. Overdosage: If you think you have taken too much of this medicine contact a poison control  center or emergency room at once. NOTE: This medicine is only for you. Do not share this medicine with others. What if I miss a dose? If you miss a dose, take it as soon as you can. If it is almost time for your next dose, take only that dose. Do not take double or extra doses. What may interact with this medicine? -aspirin and aspirin-like medicines -certain medicines that treat or prevent blood clots -dipyridamole -NSAIDs, medicines for pain and inflammation, like ibuprofen or naproxen This list may not describe all possible interactions. Give your health care provider a list of all the medicines, herbs, non-prescription drugs, or dietary supplements you use. Also tell them if you smoke, drink alcohol, or use illegal drugs. Some items may interact with your medicine. What should I watch for while using this medicine? Visit your healthcare professional for regular checks on your progress. You may need blood work done while you are taking this medicine. Your condition will be monitored carefully while you are receiving this medicine. It is important not to miss any appointments. If you are going to need surgery or other procedure, tell your healthcare professional that you are using this medicine. Using this medicine for a long time may weaken your bones and increase the risk of bone fractures. Avoid sports and activities that might cause injury while you are using this medicine. Severe falls or injuries can cause unseen bleeding. Be careful when using sharp tools or knives. Consider using an Copy. Take special care brushing or flossing your teeth. Report any injuries, bruising, or red spots on the skin to your healthcare  professional. Wear a medical ID bracelet or chain. Carry a card that describes your disease and details of your medicine and dosage times. What side effects may I notice from receiving this medicine? Side effects that you should report to your doctor or health care  professional as soon as possible: -allergic reactions like skin rash, itching or hives, swelling of the face, lips, or tongue -bone pain -signs and symptoms of bleeding such as bloody or black, tarry stools; red or dark-brown urine; spitting up blood or brown material that looks like coffee grounds; red spots on the skin; unusual bruising or bleeding from the eye, gums, or nose -signs and symptoms of a blood clot such as chest pain; shortness of breath; pain, swelling, or warmth in the leg -signs and symptoms of a stroke such as changes in vision; confusion; trouble speaking or understanding; severe headaches; sudden numbness or weakness of the face, arm or leg; trouble walking; dizziness; loss of coordination Side effects that usually do not require medical attention (report to your doctor or health care professional if they continue or are bothersome): -hair loss -pain, redness, or irritation at site where injected This list may not describe all possible side effects. Call your doctor for medical advice about side effects. You may report side effects to FDA at 1-800-FDA-1088. Where should I keep my medicine? Keep out of the reach of children. Store at room temperature between 15 and 30 degrees C (59 and 86 degrees F). Do not freeze. If your injections have been specially prepared, you may need to store them in the refrigerator. Ask your pharmacist. Throw away any unused medicine after the expiration date. NOTE: This sheet is a summary. It may not cover all possible information. If you have questions about this medicine, talk to your doctor, pharmacist, or health care provider.  2019 Elsevier/Gold Standard (2017-05-16 11:25:34)   Cast or Splint Care, Adult Casts and splints are supports that are worn to protect broken bones and other injuries. A cast or splint may hold a bone still and in the correct position while it heals. Casts and splints may also help to ease pain, swelling, and muscle  spasms. How to care for your cast   Do not stick anything inside the cast to scratch your skin.  Check the skin around the cast every day. Tell your doctor about any concerns.  You may put lotion on dry skin around the edges of the cast. Do not put lotion on the skin under the cast.  Keep the cast clean.  If the cast is not waterproof: ? Do not let it get wet. ? Cover it with a watertight covering when you take a bath or a shower. How to care for your splint   Wear it as told by your doctor. Take it off only as told by your doctor.  Loosen the splint if your fingers or toes tingle, get numb, or turn cold and blue.  Keep the splint clean.  If the splint is not waterproof: ? Do not let it get wet. ? Cover it with a watertight covering when you take a bath or a shower. Follow these instructions at home: Bathing  Do not take baths or swim until your doctor says it is okay. Ask your doctor if you can take showers. You may only be allowed to take sponge baths for bathing.  If your cast or splint is not waterproof, cover it with a watertight covering when you take a bath or shower.  Managing pain, stiffness, and swelling  Move your fingers or toes often to avoid stiffness and to lessen swelling.  Raise (elevate) the injured area above the level of your heart while sitting or lying down. Safety  Do not use the injured limb to support your body weight until your doctor says that it is okay.  Use crutches or other assistive devices as told by your doctor. General instructions  Do not put pressure on any part of the cast or splint until it is fully hardened. This may take many hours.  Return to your normal activities as told by your doctor. Ask your doctor what activities are safe for you.  Keep all follow-up visits as told by your doctor. This is important. Contact a doctor if:  Your cast or splint gets damaged.  The skin around the cast gets red or raw.  The skin under  the cast is very itchy or painful.  Your cast or splint feels very uncomfortable.  Your cast or splint is too tight or too loose.  Your cast becomes wet or it starts to have a soft spot or area.  You get an object stuck under your cast. Get help right away if:  Your pain gets worse.  The injured area tingles, gets numb, or turns blue and cold.  The part of your body above or below the cast is swollen and it turns a different color (is discolored).  You cannot feel or move your fingers or toes.  There is fluid leaking through the cast.  You have very bad pain or pressure under the cast.  You have trouble breathing.  You have shortness of breath.  You have chest pain. This information is not intended to replace advice given to you by your health care provider. Make sure you discuss any questions you have with your health care provider. Document Released: 09/20/2010 Document Revised: 05/11/2016 Document Reviewed: 05/11/2016 Elsevier Interactive Patient Education  2019 Else[employer] Inc.   Lumbar Spine Fracture A lumbar spine fracture is a break in one of the bones of the lower back. Lumbar spine fractures can vary from mild to severe. The most severe types are those that:  Cause the broken bones to move out of place (unstable).  Injure or press on the spinal cord. During recovery, it is normal to have pain and stiffness in the lower back for weeks. What are the causes? This condition may be caused by:  A fall.  A car accident.  A gunshot wound.  A hard, direct hit to the back. What increases the risk? You are more likely to develop this condition if:  You are in a situation that could result in a fall or other violent injury.  You have a condition that causes weakness in the bones (osteoporosis). What are the signs or symptoms? The main symptom of this condition is severe pain in the lower back. If a fracture is complex or severe, there may also be:  A misshapen or  swollen area on the lower back.  Limited ability to move an area of the lower back.  Inability to empty the bladder (urinary retention).  Loss of bowel or bladder control (incontinence).  Loss of strength or sensation in the legs, feet, and toes.  Inability to move (paralysis). How is this diagnosed? This condition is diagnosed based on:  A physical exam.  Symptoms and what happened just before they developed.  The results of imaging tests, such as an X-ray, CT scan, or  MRI. If your nerves have been damaged, you may also have other tests to find out the extent of the damage. How is this treated? Treatment for this condition depends on how severe the injury is. Most fractures can be treated with:  A back brace.  Bed rest and activity restrictions.  Pain medicine.  Physical therapy. Fractures that are complex, involve multiple bones, or make the spine unstable may require surgery. Surgery is done:  To remove pressure from the nerves or spinal cord.  To stabilize the broken pieces of bone. Follow these instructions at home: Medicines  Take over-the-counter and prescription medicines only as told by your health care provider.  Do not drive or use heavy machinery while taking prescription pain medicine.  If you are taking prescription pain medicine, take actions to prevent or treat constipation. Your health care provider may recommend that you: ? Drink enough fluid to keep your urine pale yellow. ? Eat foods that are high in fiber, such as fresh fruits and vegetables, whole grains, and beans. ? Limit foods that are high in fat and processed sugars, such as fried or sweet foods. ? Take an over-the-counter or prescription medicine for constipation. If you have a brace:  Wear the back brace as told by your health care provider. Remove it only as told by your health care provider.  Keep the brace clean.  If the brace is not waterproof: ? Do not let it get wet. ? Cover it  with a watertight covering when you take a bath or a shower. Activity  Stay in bed (on bed rest) only as directed by your health care provider.  Do exercises to improve motion and strength in your back (physical therapy), if your health care provider tells you to do so.  Return to your normal activities as directed by your health care provider. Ask your health care provider what activities are safe for you. Managing pain, stiffness, and swelling   If directed, put ice on the injured area: ? If you have a removable brace, remove it as told by your health care provider. ? Put ice in a plastic bag. ? Place a towel between your skin and the bag. ? Leave the ice on for 20 minutes, 2-3 times a day. General instructions  Do not use any products that contain nicotine or tobacco, such as cigarettes and e-cigarettes. These can delay healing after injury. If you need help quitting, ask your health care provider.  Do not drink alcohol. Alcohol can interfere with your treatment.  Keep all follow-up visits as directed by your health care provider. This is important. ? Failing to follow up as recommended could result in permanent injury, disability, or long-lasting (chronic) pain. Contact a health care provider if:  You have a fever.  Your pain medicine is not helping.  Your pain does not get better over time.  You cannot return to your normal activities as planned or expected. Get help right away if:  You have difficulty breathing.  Your pain is very bad and it suddenly gets worse.  You have numbness, tingling, or weakness in any part of your body.  You are unable to empty your bladder.  You cannot control your bladder or bowels.  You are unable to move any body part (paralysis) that is below the level of your injury.  You vomit.  You have pain in your abdomen. Summary  A lumbar spine fracture is a break in one of the bones of the lower  back.  The main symptom of this  condition is severe pain in the lower back. If a fracture is complex, there may also be numbness, tingling, or paralysis in the legs.  Treatment depends on how severe the injury is. Most fractures can be treated with a back brace, bed rest and activity restrictions, pain medicine, and physical therapy.  Fractures that are complex, involve multiple bones, or make the spine unstable may require surgery. This information is not intended to replace advice given to you by your health care provider. Make sure you discuss any questions you have with your health care provider. Document Released: 09/05/2006 Document Revised: 07/06/2017 Document Reviewed: 07/06/2017 Elsevier Interactive Patient Education  2019 [employer].  How to Use a Back Brace  A back brace is a form-fitting device that wraps around your trunk to support your lower back, abdomen, and hips. You may need to wear a back brace to relieve back pain or to correct a medical condition related to the back, such as abnormal curvature of the spine (scoliosis). A back brace can maintain or correct the shape of the spine and prevent a spinal problem from getting worse. A back brace can also take pressure off the layers of tissue (disks) between the bones of the spine (vertebrae). You may need a back brace to keep your back and spine in place while you heal from an injury or recover from surgery. Back braces can be either plastic (rigid brace) or soft elastic (dynamic brace). A rigid brace usually covers both the front and back of the entire upper body. A soft brace may cover only the lower back and abdomen and may fasten with self-adhesive elastic straps. Your health care provider will recommend the proper brace for your needs and medical condition. What are the risks?  A back brace may not help if you do not wear it as directed by your health care provider. Be sure to wear the brace exactly as instructed in order to prevent further back  problems.  Wearing the brace may be uncomfortable at first. You may have trouble sleeping with it on. It may also be hard for you to do certain activities while wearing the brace.   How to use a back brace be in the TLSO if the head of bed is 30 degrees or higher and when out of bed.  don and doff the TLSO in bed.  Different types of braces will have different instructions for use. Follow instructions from your health care provider about:  How to put on the brace.  When and how often to wear the brace. In some cases, braces may need to be worn for long stretches of time. For example, a brace may need to be worn for 16-23 hours a day when used for scoliosis.  How to take off the brace.  Any safety tips you should follow when wearing the brace. This may include: ? Moving carefully while wearing the brace. The brace restricts your movement and could lead to additional injuries. ? Using a cane or walker for support if you feel unsteady. ? Sitting in high, firm chairs. It may be difficult to stand up from low, soft chairs. How to care for a back brace  Do not let the back brace get wet. Typically, you will remove the brace for bathing and then put it back on afterward.  If you have a rigid brace, be sure to store it in a safe place when you are not wearing  it. This will help to prevent damage.  Clean or wash the back brace with mild soap and water as told by your health care provider. Contact a health care provider if:  Your brace gets damaged.  You have pain or discomfort when wearing the back brace.  Your back pain is getting worse or is not improving over time. This information is not intended to replace advice given to you by your health care provider. Make sure you discuss any questions you have with your health care provider. Document Released: 05/10/2011 Document Revised: 06/16/2015 Document Reviewed: 01/12/2015 Elsevier Interactive Patient Education  2019 Elsevier  Inc.   RIB FRACTURES  HOME INSTRUCTIONS   1. PAIN CONTROL:  1. Pain is best controlled by a usual combination of three different methods TOGETHER:  i. Ice/Heat ii. Over the counter pain medication iii. Prescription pain medication 2. You may experience some swelling and bruising in area of broken ribs. Ice packs or heating pads (30-60 minutes up to 6 times a day) will help. Use ice for the first few days to help decrease swelling and bruising, then switch to heat to help relax tight/sore spots and speed recovery. Some people prefer to use ice alone, heat alone, alternating between ice & heat. Experiment to what works for you. Swelling and bruising can take several weeks to resolve.  3. It is helpful to take an over-the-counter pain medication regularly for the first few weeks. Choose one of the following that works best for you:  i. Naproxen (Aleve, etc) Two 220mg  tabs twice a day ii. Ibuprofen (Advil, etc) Three 200mg  tabs four times a day (every meal & bedtime) iii. Acetaminophen (Tylenol, etc) 500-650mg  four times a day (every meal & bedtime) 4. A prescription for pain medication (such as oxycodone, hydrocodone, etc) may be given to you upon discharge. Take your pain medication as prescribed.  i. If you are having problems/concerns with the prescription medicine (does not control pain, nausea, vomiting, rash, itching, etc), please call us [phone] to see if we need to switch you to a different pain medicine that will work better for you and/or control your side effect better. ii. If you need a refill on your pain medication, please contact your pharmacy. They will contact our office to request authorization. Prescriptions will not be filled after 5 pm or on week-ends. 1. Avoid getting constipated. When taking pain medications, it is common to experience some constipation. Increasing fluid intake and taking a fiber supplement (such as Metamucil, Citrucel, FiberCon, MiraLax, etc) 1-2 times  a day regularly will usually help prevent this problem from occurring. A mild laxative (prune juice, Milk of Magnesia, MiraLax, etc) should be taken according to package directions if there are no bowel movements after 48 hours.  2. Watch out for diarrhea. If you have many loose bowel movements, simplify your diet to bland foods & liquids for a few days. Stop any stool softeners and decrease your fiber supplement. Switching to mild anti-diarrheal medications (Kayopectate, Pepto Bismol) can help. If this worsens or does not improve, please call us. 3. FOLLOW UP  a. If a follow up appointment is needed one will be scheduled for you. If none is needed with our trauma team, please follow up with your primary care provider within 2-3 weeks from discharge. Please call [facility] at [phone] if you have any questions about follow up.  b. If you have any orthopedic or other injuries you will need to follow up as outlined in your  follow up instructions.   WHEN TO CALL us [phone]:  1. Poor pain control 2. Reactions / problems with new medications (rash/itching, nausea, etc)  3. Fever over 101.5 F (38.5 C) 4. Worsening swelling or bruising 5. Worsening pain, productive cough, difficulty breathing or any other concerning symptoms  The clinic staff is available to answer your questions during regular business hours (8:30am-5pm). Please dont hesitate to call and ask to speak to one of our nurses for clinical concerns.  If you have a medical emergency, go to the nearest emergency room or call 911.  A surgeon from [facility] is always on call at the [facility], [place]  [address], [address], [place], [place] [address] ?  MAIN: [phone] ? TOLL FREE: [phone] ?  FAX [phone]  [email or web address]      Information on Rib Fractures  A rib fracture is a break or crack in one of the bones of the ribs. The ribs are long, curved  bones that wrap around your chest and attach to your spine and your breastbone. The ribs protect your heart, lungs, and other organs in the chest. A broken or cracked rib is often painful but is not usually serious. Most rib fractures heal on their own over time. However, rib fractures can be more serious if multiple ribs are broken or if broken ribs move out of place and push against other structures or organs. What are the causes? This condition is caused by:  Repetitive movements with high force, such as pitching a baseball or having severe coughing spells.  A direct blow to the chest, such as a sports injury, a car accident, or a fall.  Cancer that has spread to the bones, which can weaken bones and cause them to break. What are the signs or symptoms? Symptoms of this condition include:  Pain when you breathe in or cough.  Pain when someone presses on the injured area.  Feeling short of breath. How is this diagnosed? This condition is diagnosed with a physical exam and medical history. Imaging tests may also be done, such as:  Chest X-ray.  CT scan.  MRI.  Bone scan.  Chest ultrasound. How is this treated? Treatment for this condition depends on the severity of the fracture. Most rib fractures usually heal on their own in 1-3 months. Sometimes healing takes longer if there is a cough that does not stop or if there are other activities that make the injury worse (aggravating factors). While you heal, you will be given medicines to control the pain. You will also be taught deep breathing exercises. Severe injuries may require hospitalization or surgery. Follow these instructions at home: Managing pain, stiffness, and swelling  If directed, apply ice to the injured area. ? Put ice in a plastic bag. ? Place a towel between your skin and the bag. ? Leave the ice on for 20 minutes, 2-3 times a day.  Take over-the-counter and prescription medicines only as told by your health  care provider. Activity  Avoid a lot of activity and any activities or movements that cause pain. Be careful during activities and avoid bumping the injured rib.  Slowly increase your activity as told by your health care provider. General instructions  Do deep breathing exercises as told by your health care provider. This helps prevent pneumonia, which is a common complication of a broken rib. Your health care provider may instruct you to: ? Take deep breaths  several times a day. ? Try to cough several times a day, holding a pillow against the injured area. ? Use a device called incentive spirometer to practice deep breathing several times a day.  Drink enough fluid to keep your urine pale yellow.  Do not wear a rib belt or binder. These restrict breathing, which can lead to pneumonia.  Keep all follow-up visits as told by your health care provider. This is important. Contact a health care provider if:  You have a fever. Get help right away if:  You have difficulty breathing or you are short of breath.  You develop a cough that does not stop, or you cough up thick or bloody sputum.  You have nausea, vomiting, or pain in your abdomen.  Your pain gets worse and medicine does not help. Summary  A rib fracture is a break or crack in one of the bones of the ribs.  A broken or cracked rib is often painful but is not usually serious.  Most rib fractures heal on their own over time.  Treatment for this condition depends on the severity of the fracture.  Avoid a lot of activity and any activities or movements that cause pain. This information is not intended to replace advice given to you by your health care provider. Make sure you discuss any questions you have with your health care provider. Document Released: 05/21/2005 Document Revised: 08/20/2016 Document Reviewed: 08/20/2016 Elsevier Interactive Patient Education  2019 [employer]

## 2018-07-22 ENCOUNTER — Ambulatory Visit: Payer: Self-pay | Admitting: Student

## 2018-07-22 DIAGNOSIS — S82871C Displaced pilon fracture of right tibia, initial encounter for open fracture type IIIA, IIIB, or IIIC: Secondary | ICD-10-CM

## 2018-07-25 NOTE — Pre-Procedure Instructions (Addendum)
   Judy Hernandez  07/25/2018     [id number] [place], [place] - [address] [place] [address] Phone: [phone] Fax: [phone]  [facility] - [place], [place] - [address] [place] [address] Phone: [phone] Fax: [phone]   Your procedure is scheduled on Friday, August 01, 2018  Report to [facility] Admitting at 5:30 A.M.  Call this number if you have problems the morning of surgery:  [phone]   Remember: Brush your teeth the morning of surgery.  Do not eat or drink after midnight Thursday, July 31, 2018  Take these medicines the morning of surgery with A SIP OF WATER: citalopram (CELEXA), famotidine (PEPCID), gabapentin (NEURONTIN), varenicline (CHANTIX), if needed: Pain medication ( Tylenol OR Oxycodone) Stop taking Aspirin ( unless advised otherwise by your surgeon), vitamins, fish oil and herbal medications. Do not take any NSAIDs ie: Ibuprofen, Advil, Naproxen (Aleve), Motrin, BC and Goody Powder; stop now   Do not wear jewelry, make-up or nail polish.  Do not wear lotions, powders, or perfumes, or deodorant.  Do not shave 48 hours prior to surgery.    Do not bring valuables to the hospital.  [facility] is not responsible for any belongings or valuables.  Contacts, dentures or bridgework may not be worn into surgery.  Leave your suitcase in the car.  After surgery it may be brought to your room. Patients discharged the day of surgery will not be allowed to drive home.  Special instructions:See [place] " Preparing For Surgery " sheet. Please read over the following fact sheets that you were given. Pain Booklet, Coughing and Deep Breathing, MRSA Information and Surgical Site Infection Prevention

## 2018-07-28 ENCOUNTER — Inpatient Hospital Stay (HOSPITAL_COMMUNITY)
Admission: RE | Admit: 2018-07-28 | Discharge: 2018-07-28 | Disposition: A | Discharge status: Home / Self Care | Payer: Medicaid - Out of State | Source: Ambulatory Visit
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
## 2018-07-31 ENCOUNTER — Other Ambulatory Visit: Payer: Self-pay

## 2018-07-31 ENCOUNTER — Encounter (HOSPITAL_COMMUNITY)
Admission: RE | Admit: 2018-07-31 | Discharge: 2018-07-31 | Disposition: A | Discharge status: Home / Self Care | Payer: Medicaid - Out of State | Source: Ambulatory Visit | Attending: Student | Admitting: Student

## 2018-07-31 ENCOUNTER — Encounter (HOSPITAL_COMMUNITY): Payer: Self-pay

## 2018-07-31 DIAGNOSIS — Z01818 Encounter for other preprocedural examination: Secondary | ICD-10-CM | POA: Insufficient documentation

## 2018-07-31 LAB — CBC
HCT: 44.5 % (ref 36.0–46.0)
Hemoglobin: 13.8 g/dL (ref 12.0–15.0)
MCH: 31 pg (ref 26.0–34.0)
MCHC: 31 g/dL (ref 30.0–36.0)
MCV: 100 fL (ref 80.0–100.0)
Platelets: 306 10*3/uL (ref 150–400)
RBC: 4.45 MIL/uL (ref 3.87–5.11)
RDW: 13.2 % (ref 11.5–15.5)
WBC: 8.1 10*3/uL (ref 4.0–10.5)
nRBC: 0 % (ref 0.0–0.2)

## 2018-07-31 LAB — SURGICAL PCR SCREEN
MRSA, PCR: NEGATIVE
STAPHYLOCOCCUS AUREUS: POSITIVE — AB

## 2018-07-31 NOTE — Progress Notes (Signed)
PCP - Crisoforo Oxford NFP @ [facility] in [facility] Cardiologist - na  Chest x-ray - na EKG - todat Stress Test - 2017?  ECHO - na Cardiac Cath - na  Sleep Study - na CPAP -   Fasting Blood Sugar - na Checks Blood Sugar _____ times a day  Blood Thinner Instructions: na Aspirin Instructions:  na  Anesthesia review:   Patient denies shortness of breath, fever, cough and chest pain at PAT appointment   Patient verbalized understanding of instructions that were given to them at the PAT appointment. Patient was also instructed that they will need to review over the PAT instructions again at home before surgery.
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
## 2018-07-31 NOTE — Anesthesia Preprocedure Evaluation (Addendum)
Anesthesia Evaluation  Patient identified by MRN, date of birth, ID band Patient awake    Reviewed: Allergy & Precautions, NPO status , Patient's Chart, lab work & pertinent test results  Airway Mallampati: II  TM Distance: >3 FB Neck ROM: Full    Dental  (+) Edentulous Upper, Dental Advisory Given, Missing,    Pulmonary neg pulmonary ROS, former smoker,    Pulmonary exam normal breath sounds clear to auscultation       Cardiovascular negative cardio ROS Normal cardiovascular exam Rhythm:Regular Rate:Normal     Neuro/Psych PSYCHIATRIC DISORDERS Anxiety Depression negative neurological ROS     GI/Hepatic negative GI ROS, Neg liver ROS,   Endo/Other  negative endocrine ROS  Renal/GU negative Renal ROS     Musculoskeletal  (+) Arthritis ,   Abdominal   Peds  Hematology negative hematology ROS (+)   Anesthesia Other Findings   Reproductive/Obstetrics negative OB ROS                                                            Anesthesia Evaluation  Patient identified by MRN, date of birth, ID band Patient awake    Reviewed: Allergy & Precautions, NPO status , Patient's Chart, lab work & pertinent test results  History of Anesthesia Complications Negative for: history of anesthetic complications  Airway Mallampati: II  TM Distance: >3 FB Neck ROM: Full    Dental  (+) Dental Advisory Given, Edentulous Upper, Missing,    Pulmonary former smoker,    Pulmonary exam normal breath sounds clear to auscultation       Cardiovascular negative cardio ROS Normal cardiovascular exam Rhythm:Regular Rate:Normal     Neuro/Psych Anxiety Depression negative neurological ROS     GI/Hepatic negative GI ROS, Neg liver ROS,   Endo/Other  negative endocrine ROS  Renal/GU negative Renal ROS     Musculoskeletal  (+) Arthritis ,   Abdominal   Peds  Hematology negative  hematology ROS (+)   Anesthesia Other Findings Day of surgery medications reviewed with the patient.  Reproductive/Obstetrics                            Anesthesia Physical Anesthesia Plan  ASA: II  Anesthesia Plan: General   Post-op Pain Management:    Induction: Intravenous  PONV Risk Score and Plan: 3 and Treatment may vary due to age or medical condition, Ondansetron, Dexamethasone and Midazolam  Airway Management Planned: LMA  Additional Equipment:   Intra-op Plan:   Post-operative Plan: Extubation in OR  Informed Consent: I have reviewed the patients History and Physical, chart, labs and discussed the procedure including the risks, benefits and alternatives for the proposed anesthesia with the patient or authorized representative who has indicated his/her understanding and acceptance.   Dental advisory given  Plan Discussed with: CRNA  Anesthesia Plan Comments: (Pt prefers no regional anesthesia.)      Anesthesia Quick Evaluation  Anesthesia Physical Anesthesia Plan  ASA: II  Anesthesia Plan: General   Post-op Pain Management: GA combined w/ Regional for post-op pain   Induction: Intravenous  PONV Risk Score and Plan: 3 and Ondansetron, Dexamethasone, Midazolam and Treatment may vary due to age or medical condition  Airway Management Planned: Oral ETT  Additional Equipment: None  Intra-op Plan:   Post-operative Plan: Extubation in OR  Informed Consent: I have reviewed the patients History and Physical, chart, labs and discussed the procedure including the risks, benefits and alternatives for the proposed anesthesia with the patient or authorized representative who has indicated his/her understanding and acceptance.     Dental advisory given  Plan Discussed with: CRNA  Anesthesia Plan Comments: (PAT note written 07/31/2018 by Shonna Chock, PA-C. )      Anesthesia Quick Evaluation                                   Anesthesia Evaluation  Patient identified by MRN, date of birth, ID band Patient awake    Reviewed: Allergy & Precautions, NPO status , Patient's Chart, lab work & pertinent test results  History of Anesthesia Complications Negative for: history of anesthetic complications  Airway Mallampati: II  TM Distance: >3 FB Neck ROM: Full    Dental  (+) Dental Advisory Given, Edentulous Upper, Missing,    Pulmonary former smoker,    Pulmonary exam normal breath sounds clear to auscultation       Cardiovascular negative cardio ROS Normal cardiovascular exam Rhythm:Regular Rate:Normal     Neuro/Psych Anxiety Depression negative neurological ROS     GI/Hepatic negative GI ROS, Neg liver ROS,   Endo/Other  negative endocrine ROS  Renal/GU negative Renal ROS     Musculoskeletal  (+) Arthritis ,   Abdominal   Peds  Hematology negative hematology ROS (+)   Anesthesia Other Findings Day of surgery medications reviewed with the patient.  Reproductive/Obstetrics                            Anesthesia Physical Anesthesia Plan  ASA: II  Anesthesia Plan: General   Post-op Pain Management:    Induction: Intravenous  PONV Risk Score and Plan: 3 and Treatment may vary due to age or medical condition, Ondansetron, Dexamethasone and Midazolam  Airway Management Planned: LMA  Additional Equipment:   Intra-op Plan:   Post-operative Plan: Extubation in OR  Informed Consent: I have reviewed the patients History and Physical, chart, labs and discussed the procedure including the risks, benefits and alternatives for the proposed anesthesia with the patient or authorized representative who has indicated his/her understanding and acceptance.   Dental advisory given  Plan Discussed with: CRNA  Anesthesia Plan Comments: (Pt prefers no regional anesthesia.)      Anesthesia Quick Evaluation                                    Anesthesia  Evaluation  Patient identified by MRN, date of birth, ID band Patient awake    Reviewed: Allergy & Precautions, NPO status , Patient's Chart, lab work & pertinent test results  Airway Mallampati: II  TM Distance: >3 FB Neck ROM: Full    Dental  (+) Dental Advisory Given   Pulmonary former smoker,    breath sounds clear to auscultation       Cardiovascular negative cardio ROS   Rhythm:Regular Rate:Normal     Neuro/Psych negative neurological ROS     GI/Hepatic negative GI ROS, Neg liver ROS,   Endo/Other  negative endocrine ROS  Renal/GU negative Renal ROS     Musculoskeletal  (+) Arthritis ,   Abdominal  Peds  Hematology  (+) anemia ,   Anesthesia Other Findings   Reproductive/Obstetrics                            Lab Results  Component Value Date   WBC 8.1 07/31/2018   HGB 13.8 07/31/2018   HCT 44.5 07/31/2018   MCV 100.0 07/31/2018   PLT 306 07/31/2018   Lab Results  Component Value Date   CREATININE 0.66 06/09/2018   BUN 9 06/09/2018   NA 134 (L) 06/09/2018   K 4.7 06/09/2018   CL 101 06/09/2018   CO2 26 06/09/2018    Anesthesia Physical Anesthesia Plan  ASA: II  Anesthesia Plan: General   Post-op Pain Management:  Regional for Post-op pain   Induction: Intravenous  PONV Risk Score and Plan: 3 and Dexamethasone, Ondansetron, Treatment may vary due to age or medical condition and Midazolam  Airway Management Planned: LMA  Additional Equipment:   Intra-op Plan:   Post-operative Plan: Extubation in OR  Informed Consent: I have reviewed the patients History and Physical, chart, labs and discussed the procedure including the risks, benefits and alternatives for the proposed anesthesia with the patient or authorized representative who has indicated his/her understanding and acceptance.   Dental advisory given  Plan Discussed with: CRNA  Anesthesia Plan Comments:         Anesthesia  Quick Evaluation

## 2018-08-01 ENCOUNTER — Observation Stay (HOSPITAL_COMMUNITY): Payer: Medicaid - Out of State

## 2018-08-01 ENCOUNTER — Observation Stay (HOSPITAL_COMMUNITY)
Admission: RE | Admit: 2018-08-01 | Discharge: 2018-08-02 | Disposition: A | Discharge status: Home / Self Care | Payer: Medicaid - Out of State | Attending: Student | Admitting: Student

## 2018-08-01 ENCOUNTER — Ambulatory Visit (HOSPITAL_COMMUNITY): Payer: Medicaid - Out of State | Admitting: Anesthesiology

## 2018-08-01 ENCOUNTER — Encounter (HOSPITAL_COMMUNITY): Payer: Self-pay

## 2018-08-01 ENCOUNTER — Ambulatory Visit (HOSPITAL_COMMUNITY): Payer: Medicaid - Out of State

## 2018-08-01 ENCOUNTER — Encounter (HOSPITAL_COMMUNITY)
Admission: RE | Disposition: A | Discharge status: Home / Self Care | Payer: Self-pay | Source: Home / Self Care | Attending: Student

## 2018-08-01 DIAGNOSIS — Z419 Encounter for procedure for purposes other than remedying health state, unspecified: Secondary | ICD-10-CM

## 2018-08-01 DIAGNOSIS — Z7901 Long term (current) use of anticoagulants: Secondary | ICD-10-CM | POA: Diagnosis not present

## 2018-08-01 DIAGNOSIS — S82871A Displaced pilon fracture of right tibia, initial encounter for closed fracture: Secondary | ICD-10-CM | POA: Diagnosis not present

## 2018-08-01 DIAGNOSIS — Z87891 Personal history of nicotine dependence: Secondary | ICD-10-CM | POA: Diagnosis not present

## 2018-08-01 DIAGNOSIS — F329 Major depressive disorder, single episode, unspecified: Secondary | ICD-10-CM | POA: Insufficient documentation

## 2018-08-01 DIAGNOSIS — F419 Anxiety disorder, unspecified: Secondary | ICD-10-CM | POA: Insufficient documentation

## 2018-08-01 DIAGNOSIS — S82871C Displaced pilon fracture of right tibia, initial encounter for open fracture type IIIA, IIIB, or IIIC: Secondary | ICD-10-CM

## 2018-08-01 DIAGNOSIS — S82871N Displaced pilon fracture of right tibia, subsequent encounter for open fracture type IIIA, IIIB, or IIIC with nonunion: Secondary | ICD-10-CM | POA: Diagnosis present

## 2018-08-01 DIAGNOSIS — Z79899 Other long term (current) drug therapy: Secondary | ICD-10-CM | POA: Diagnosis not present

## 2018-08-01 DIAGNOSIS — T148XXA Other injury of unspecified body region, initial encounter: Secondary | ICD-10-CM

## 2018-08-01 HISTORY — PX: OPEN REDUCTION INTERNAL FIXATION (ORIF) TIBIA/FIBULA FRACTURE: SHX5992

## 2018-08-01 SURGERY — OPEN REDUCTION INTERNAL FIXATION (ORIF) TIBIA/FIBULA FRACTURE
Anesthesia: General | Laterality: Right

## 2018-08-01 MED ORDER — PROMETHAZINE HCL 25 MG/ML IJ SOLN
INTRAMUSCULAR | Status: AC
  Filled 2018-08-01: qty 1

## 2018-08-01 MED ORDER — METHOCARBAMOL 750 MG PO TABS
750.0000 mg | ORAL_TABLET | Freq: Three times a day (TID) | ORAL | Status: DC
  Administered 2018-08-01 – 2018-08-02 (×4): 750 mg via ORAL
  Filled 2018-08-01 (×4): qty 1

## 2018-08-01 MED ORDER — BACITRACIN ZINC 500 UNIT/GM EX OINT
TOPICAL_OINTMENT | CUTANEOUS | Status: AC
  Filled 2018-08-01: qty 28.35

## 2018-08-01 MED ORDER — HYDROMORPHONE HCL 1 MG/ML IJ SOLN
INTRAMUSCULAR | Status: AC
  Administered 2018-08-01: 0.5 mg via INTRAVENOUS
  Filled 2018-08-01: qty 1

## 2018-08-01 MED ORDER — MEPERIDINE HCL 50 MG/ML IJ SOLN: 6.2500 mg | INTRAMUSCULAR | Status: DC | PRN

## 2018-08-01 MED ORDER — DEXAMETHASONE SODIUM PHOSPHATE 10 MG/ML IJ SOLN
INTRAMUSCULAR | Status: AC
  Filled 2018-08-01: qty 1

## 2018-08-01 MED ORDER — ASPIRIN 325 MG PO TABS
325.0000 mg | ORAL_TABLET | Freq: Every day | ORAL | Status: DC
  Administered 2018-08-02: 325 mg via ORAL
  Filled 2018-08-01: qty 1

## 2018-08-01 MED ORDER — MIDAZOLAM HCL 2 MG/2ML IJ SOLN
INTRAMUSCULAR | Status: AC
  Filled 2018-08-01: qty 2

## 2018-08-01 MED ORDER — VANCOMYCIN HCL 1000 MG IV SOLR
INTRAVENOUS | Status: AC
  Filled 2018-08-01: qty 1000

## 2018-08-01 MED ORDER — MIDAZOLAM HCL 2 MG/2ML IJ SOLN
INTRAMUSCULAR | Status: DC | PRN
  Administered 2018-08-01: 2 mg via INTRAVENOUS

## 2018-08-01 MED ORDER — CEFAZOLIN SODIUM-DEXTROSE 2-4 GM/100ML-% IV SOLN
2.0000 g | Freq: Three times a day (TID) | INTRAVENOUS | Status: AC
  Administered 2018-08-01 – 2018-08-02 (×3): 2 g via INTRAVENOUS
  Filled 2018-08-01 (×3): qty 100

## 2018-08-01 MED ORDER — OXYCODONE HCL 5 MG PO TABS
5.0000 mg | ORAL_TABLET | ORAL | Status: DC | PRN
  Administered 2018-08-01 – 2018-08-02 (×8): 10 mg via ORAL
  Filled 2018-08-01 (×7): qty 2

## 2018-08-01 MED ORDER — CHLORHEXIDINE GLUCONATE 4 % EX LIQD: 60.0000 mL | Freq: Once | CUTANEOUS | Status: DC

## 2018-08-01 MED ORDER — GABAPENTIN 300 MG PO CAPS
300.0000 mg | ORAL_CAPSULE | Freq: Three times a day (TID) | ORAL | Status: DC
  Administered 2018-08-01 – 2018-08-02 (×4): 300 mg via ORAL
  Filled 2018-08-01 (×4): qty 1

## 2018-08-01 MED ORDER — PROPOFOL 10 MG/ML IV BOLUS
INTRAVENOUS | Status: DC | PRN
  Administered 2018-08-01: 50 mg via INTRAVENOUS
  Administered 2018-08-01: 150 mg via INTRAVENOUS

## 2018-08-01 MED ORDER — CEFAZOLIN SODIUM-DEXTROSE 2-4 GM/100ML-% IV SOLN
2.0000 g | INTRAVENOUS | Status: AC
  Administered 2018-08-01: 2 g via INTRAVENOUS

## 2018-08-01 MED ORDER — FENTANYL CITRATE (PF) 250 MCG/5ML IJ SOLN
INTRAMUSCULAR | Status: DC | PRN
  Administered 2018-08-01 (×5): 50 ug via INTRAVENOUS

## 2018-08-01 MED ORDER — HYDROMORPHONE HCL 1 MG/ML IJ SOLN
0.2500 mg | INTRAMUSCULAR | Status: DC | PRN
  Administered 2018-08-01 (×4): 0.5 mg via INTRAVENOUS

## 2018-08-01 MED ORDER — FENTANYL CITRATE (PF) 250 MCG/5ML IJ SOLN
INTRAMUSCULAR | Status: AC
  Filled 2018-08-01: qty 5

## 2018-08-01 MED ORDER — CITALOPRAM HYDROBROMIDE 20 MG PO TABS
20.0000 mg | ORAL_TABLET | Freq: Every day | ORAL | Status: DC
  Administered 2018-08-02: 20 mg via ORAL
  Filled 2018-08-01: qty 1

## 2018-08-01 MED ORDER — MORPHINE SULFATE (PF) 2 MG/ML IV SOLN: 2.0000 mg | INTRAVENOUS | Status: DC | PRN

## 2018-08-01 MED ORDER — TOBRAMYCIN SULFATE 1.2 G IJ SOLR
INTRAMUSCULAR | Status: AC
  Filled 2018-08-01: qty 1.2

## 2018-08-01 MED ORDER — OXYCODONE HCL 5 MG PO TABS
ORAL_TABLET | ORAL | Status: AC
  Filled 2018-08-01: qty 2

## 2018-08-01 MED ORDER — ROCURONIUM BROMIDE 10 MG/ML (PF) SYRINGE
PREFILLED_SYRINGE | INTRAVENOUS | Status: DC | PRN
  Administered 2018-08-01: 50 mg via INTRAVENOUS

## 2018-08-01 MED ORDER — OXYCODONE HCL 5 MG/5ML PO SOLN: 5.0000 mg | Freq: Once | ORAL | Status: DC | PRN

## 2018-08-01 MED ORDER — PROMETHAZINE HCL 25 MG/ML IJ SOLN
6.2500 mg | INTRAMUSCULAR | Status: DC | PRN
  Administered 2018-08-01: 6.25 mg via INTRAVENOUS

## 2018-08-01 MED ORDER — DEXAMETHASONE SODIUM PHOSPHATE 10 MG/ML IJ SOLN
INTRAMUSCULAR | Status: DC | PRN
  Administered 2018-08-01: 5 mg via INTRAVENOUS

## 2018-08-01 MED ORDER — BUPIVACAINE HCL (PF) 0.25 % IJ SOLN
INTRAMUSCULAR | Status: DC | PRN
  Administered 2018-08-01: 30 mL
  Administered 2018-08-01: 20 mL

## 2018-08-01 MED ORDER — ROCURONIUM BROMIDE 50 MG/5ML IV SOSY
PREFILLED_SYRINGE | INTRAVENOUS | Status: AC
  Filled 2018-08-01: qty 5

## 2018-08-01 MED ORDER — LIDOCAINE 2% (20 MG/ML) 5 ML SYRINGE
INTRAMUSCULAR | Status: AC
  Filled 2018-08-01: qty 5

## 2018-08-01 MED ORDER — LIDOCAINE 2% (20 MG/ML) 5 ML SYRINGE
INTRAMUSCULAR | Status: DC | PRN
  Administered 2018-08-01: 100 mg via INTRAVENOUS

## 2018-08-01 MED ORDER — 0.9 % SODIUM CHLORIDE (POUR BTL) OPTIME
TOPICAL | Status: DC | PRN
  Administered 2018-08-01: 1000 mL

## 2018-08-01 MED ORDER — ONDANSETRON HCL 4 MG/2ML IJ SOLN
INTRAMUSCULAR | Status: AC
  Filled 2018-08-01: qty 2

## 2018-08-01 MED ORDER — EPHEDRINE SULFATE-NACL 50-0.9 MG/10ML-% IV SOSY
PREFILLED_SYRINGE | INTRAVENOUS | Status: DC | PRN
  Administered 2018-08-01 (×2): 10 mg via INTRAVENOUS

## 2018-08-01 MED ORDER — EPHEDRINE 5 MG/ML INJ
INTRAVENOUS | Status: AC
  Filled 2018-08-01: qty 10

## 2018-08-01 MED ORDER — ACETAMINOPHEN 325 MG PO TABS
650.0000 mg | ORAL_TABLET | Freq: Four times a day (QID) | ORAL | Status: DC
  Administered 2018-08-01 – 2018-08-02 (×5): 650 mg via ORAL
  Filled 2018-08-01 (×5): qty 2

## 2018-08-01 MED ORDER — PROPOFOL 10 MG/ML IV BOLUS
INTRAVENOUS | Status: AC
  Filled 2018-08-01: qty 40

## 2018-08-01 MED ORDER — VANCOMYCIN HCL 1000 MG IV SOLR
INTRAVENOUS | Status: DC | PRN
  Administered 2018-08-01: 1000 mg

## 2018-08-01 MED ORDER — SUGAMMADEX SODIUM 200 MG/2ML IV SOLN
INTRAVENOUS | Status: DC | PRN
  Administered 2018-08-01: 200 mg via INTRAVENOUS

## 2018-08-01 MED ORDER — OXYCODONE HCL 5 MG PO TABS: 5.0000 mg | ORAL_TABLET | Freq: Once | ORAL | Status: DC | PRN

## 2018-08-01 MED ORDER — CEFAZOLIN SODIUM-DEXTROSE 2-4 GM/100ML-% IV SOLN
INTRAVENOUS | Status: AC
  Filled 2018-08-01: qty 100

## 2018-08-01 MED ORDER — TOBRAMYCIN SULFATE 1.2 G IJ SOLR
INTRAMUSCULAR | Status: DC | PRN
  Administered 2018-08-01: 1.2 g

## 2018-08-01 MED ORDER — LACTATED RINGERS IV SOLN
INTRAVENOUS | Status: DC | PRN
  Administered 2018-08-01 (×2): via INTRAVENOUS

## 2018-08-01 MED ORDER — GLYCOPYRROLATE 0.2 MG/ML IJ SOLN
INTRAMUSCULAR | Status: DC | PRN
  Administered 2018-08-01: 0.2 mg via INTRAVENOUS

## 2018-08-01 MED ORDER — ONDANSETRON HCL 4 MG/2ML IJ SOLN
INTRAMUSCULAR | Status: DC | PRN
  Administered 2018-08-01: 4 mg via INTRAVENOUS

## 2018-08-01 MED ORDER — SODIUM CHLORIDE 0.9 % IV SOLN
INTRAVENOUS | Status: DC | PRN
  Administered 2018-08-01: 25 ug/min via INTRAVENOUS

## 2018-08-01 SURGICAL SUPPLY — 71 items
BANDAGE ACE 4X5 VEL STRL LF (GAUZE/BANDAGES/DRESSINGS) ×2 IMPLANT
BANDAGE ACE 6X5 VEL STRL LF (GAUZE/BANDAGES/DRESSINGS) ×2 IMPLANT
BANDAGE ESMARK 6X9 LF (GAUZE/BANDAGES/DRESSINGS) ×1 IMPLANT
BLADE CLIPPER SURG (BLADE) IMPLANT
BNDG COHESIVE 4X5 TAN STRL (GAUZE/BANDAGES/DRESSINGS) IMPLANT
BNDG ELASTIC 6X15 VLCR STRL LF (GAUZE/BANDAGES/DRESSINGS) ×2 IMPLANT
BNDG ESMARK 6X9 LF (GAUZE/BANDAGES/DRESSINGS) ×2
BNDG GAUZE ELAST 4 BULKY (GAUZE/BANDAGES/DRESSINGS) ×2 IMPLANT
BONE CHIP PRESERV 40CC PCAN1/2 (Bone Implant) ×2 IMPLANT
BRUSH SCRUB SURG 4.25 DISP (MISCELLANEOUS) ×4 IMPLANT
CHLORAPREP W/TINT 26ML (MISCELLANEOUS) ×4 IMPLANT
CLIP LOCKING FOR RIA (CLIP) ×2 IMPLANT
COVER MAYO STAND STRL (DRAPES) ×2 IMPLANT
COVER WAND RF STERILE (DRAPES) ×2 IMPLANT
DRAPE C-ARM 42X72 X-RAY (DRAPES) ×2 IMPLANT
DRAPE C-ARMOR (DRAPES) ×2 IMPLANT
DRAPE HALF SHEET 40X57 (DRAPES) ×4 IMPLANT
DRAPE INCISE IOBAN 66X45 STRL (DRAPES) ×2 IMPLANT
DRAPE U-SHAPE 47X51 STRL (DRAPES) ×2 IMPLANT
DRIVE SHAFT SEAL STERILE (ORTHOPEDIC DISPOSABLE SUPPLIES) ×2 IMPLANT
DRSG ADAPTIC 3X8 NADH LF (GAUZE/BANDAGES/DRESSINGS) ×2 IMPLANT
DRSG PAD ABDOMINAL 8X10 ST (GAUZE/BANDAGES/DRESSINGS) ×8 IMPLANT
ELECT REM PT RETURN 9FT ADLT (ELECTROSURGICAL) ×2
ELECTRODE REM PT RTRN 9FT ADLT (ELECTROSURGICAL) ×1 IMPLANT
GAUZE SPONGE 4X4 12PLY STRL (GAUZE/BANDAGES/DRESSINGS) ×2 IMPLANT
GLOVE BIO SURGEON STRL SZ 6.5 (GLOVE) ×6 IMPLANT
GLOVE BIO SURGEON STRL SZ7 (GLOVE) ×6 IMPLANT
GLOVE BIO SURGEON STRL SZ7.5 (GLOVE) ×8 IMPLANT
GLOVE BIOGEL M 7.0 STRL (GLOVE) ×6 IMPLANT
GLOVE BIOGEL PI IND STRL 6.5 (GLOVE) ×4 IMPLANT
GLOVE BIOGEL PI IND STRL 7.5 (GLOVE) ×1 IMPLANT
GLOVE BIOGEL PI INDICATOR 6.5 (GLOVE) ×4
GLOVE BIOGEL PI INDICATOR 7.5 (GLOVE) ×1
GLOVE PROGUARD SZ 7 1/2 (GLOVE) ×2 IMPLANT
GOWN STRL REUS W/ TWL LRG LVL3 (GOWN DISPOSABLE) ×4 IMPLANT
GOWN STRL REUS W/TWL LRG LVL3 (GOWN DISPOSABLE) ×4
GRAFT FILTER FOR RIA 520 LGTH (MISCELLANEOUS) ×2 IMPLANT
GUIDEWIRE 3.2X400 (WIRE) ×2 IMPLANT
KIT BASIN OR (CUSTOM PROCEDURE TRAY) ×2 IMPLANT
KIT TURNOVER KIT B (KITS) ×2 IMPLANT
MANIFOLD NEPTUNE II (INSTRUMENTS) ×2 IMPLANT
NS IRRIG 1000ML POUR BTL (IV SOLUTION) ×2 IMPLANT
PACK TOTAL JOINT (CUSTOM PROCEDURE TRAY) ×2 IMPLANT
PAD ARMBOARD 7.5X6 YLW CONV (MISCELLANEOUS) ×4 IMPLANT
PAD CAST 4YDX4 CTTN HI CHSV (CAST SUPPLIES) ×1 IMPLANT
PADDING CAST COTTON 4X4 STRL (CAST SUPPLIES) ×1
PADDING CAST COTTON 6X4 STRL (CAST SUPPLIES) ×2 IMPLANT
REAMER HEAD 13MM (MISCELLANEOUS) ×2 IMPLANT
REAMER ROD DEEP FLUTE 2.5X950 (INSTRUMENTS) ×2 IMPLANT
SPONGE LAP 18X18 RF (DISPOSABLE) IMPLANT
STAPLER VISISTAT 35W (STAPLE) ×2 IMPLANT
STOCKINETTE IMPERVIOUS LG (DRAPES) IMPLANT
SUCTION FRAZIER HANDLE 10FR (MISCELLANEOUS) ×1
SUCTION TUBE FRAZIER 10FR DISP (MISCELLANEOUS) ×1 IMPLANT
SUT ETHILON 3 0 PS 1 (SUTURE) IMPLANT
SUT MNCRL AB 3-0 PS2 18 (SUTURE) ×2 IMPLANT
SUT PROLENE 0 CT (SUTURE) IMPLANT
SUT VIC AB 0 CT1 27 (SUTURE) ×1
SUT VIC AB 0 CT1 27XBRD ANBCTR (SUTURE) ×1 IMPLANT
SUT VIC AB 1 CT1 27 (SUTURE) ×1
SUT VIC AB 1 CT1 27XBRD ANBCTR (SUTURE) ×1 IMPLANT
SUT VIC AB 2-0 CT1 27 (SUTURE) ×2
SUT VIC AB 2-0 CT1 TAPERPNT 27 (SUTURE) ×2 IMPLANT
TOWEL OR 17X24 6PK STRL BLUE (TOWEL DISPOSABLE) ×2 IMPLANT
TOWEL OR 17X26 10 PK STRL BLUE (TOWEL DISPOSABLE) ×4 IMPLANT
TRAY FOLEY MTR SLVR 16FR STAT (SET/KITS/TRAYS/PACK) IMPLANT
TUBE ASSEMBLY RIA STERILE (MISCELLANEOUS) ×2 IMPLANT
TUBE CONNECTING 12X1/4 (SUCTIONS) ×2 IMPLANT
TUBING IRRIGATION (TUBING) ×2 IMPLANT
WATER STERILE IRR 1000ML POUR (IV SOLUTION) ×4 IMPLANT
YANKAUER SUCT BULB TIP NO VENT (SUCTIONS) ×4 IMPLANT
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
## 2018-08-01 NOTE — Anesthesia Procedure Notes (Addendum)
Procedure Name: Intubation Date/Time: 08/01/2018 7:40 AM Performed by: Valda Favia, CRNA Pre-anesthesia Checklist: Patient identified, Emergency Drugs available, Suction available and Patient being monitored Patient Re-evaluated:Patient Re-evaluated prior to induction Oxygen Delivery Method: Circle System Utilized Preoxygenation: Pre-oxygenation with 100% oxygen Induction Type: IV induction Ventilation: Mask ventilation without difficulty Laryngoscope Size: Mac and 4 Grade View: Grade I Tube type: Oral Tube size: 7.0 mm Number of attempts: 1 Airway Equipment and Method: Stylet Placement Confirmation: ETT inserted through vocal cords under direct vision,  positive ETCO2 and breath sounds checked- equal and bilateral Secured at: 20 cm Tube secured with: Tape Dental Injury: Teeth and Oropharynx as per pre-operative assessment

## 2018-08-01 NOTE — Interval H&P Note (Signed)
History and Physical Interval Note:  08/01/2018 7:16 AM  Judy Hernandez  has presented today for surgery, with the diagnosis of Right distal tibia nonunion  The various methods of treatment have been discussed with the patient and family. After consideration of risks, benefits and other options for treatment, the patient has consented to  Procedure(s): REPAIR OF RIGHT PILON NONUNION WITH RIA HARVEST OF RIGHT FEMUR (Right) as a surgical intervention .  The patient's history has been reviewed, patient examined, no change in status, stable for surgery.  I have reviewed the patient's chart and labs.  Questions were answered to the patient's satisfaction.     Caryn Bee P Rudean Icenhour

## 2018-08-01 NOTE — Anesthesia Procedure Notes (Signed)
Anesthesia Regional Block: Adductor canal block   Pre-Anesthetic Checklist: ,, timeout performed, Correct Patient, Correct Site, Correct Laterality, Correct Procedure, Correct Position, site marked, Risks and benefits discussed,  Surgical consent,  Pre-op evaluation,  At surgeon's request and post-op pain management  Laterality: Right  Prep: chloraprep       Needles:  Injection technique: Single-shot  Needle Type: Stimiplex     Needle Length: 9cm  Needle Gauge: 21     Additional Needles:   Procedures:,,,, ultrasound used (permanent image in chart),,,,  Narrative:  Start time: 08/01/2018 7:05 AM End time: 08/01/2018 7:07 AM Injection made incrementally with aspirations every 5 mL.  Performed by: Personally  Anesthesiologist: Lewie Loron, MD  Additional Notes: BP cuff, EKG monitors applied. Sedation begun. Artery and nerve location verified with U/S and anesthetic injected incrementally, slowly, and after negative aspirations under direct u/s guidance. Good fascial /perineural spread. Tolerated well.

## 2018-08-01 NOTE — Anesthesia Postprocedure Evaluation (Signed)
Anesthesia Post Note  Patient: Judy Hernandez  Procedure(s) Performed: REPAIR OF RIGHT PILON NONUNION WITH RIA HARVEST OF RIGHT FEMUR (Right )     Patient location during evaluation: PACU Anesthesia Type: General Level of consciousness: sedated and patient cooperative Pain management: pain level controlled Vital Signs Assessment: post-procedure vital signs reviewed and stable Respiratory status: spontaneous breathing Cardiovascular status: stable Anesthetic complications: no    Last Vitals:  Vitals:   08/01/18 1031 08/01/18 1303  BP: 134/84 105/82  Pulse: 74 85  Resp:  18  Temp:  36.8 C  SpO2: 95% 94%    Last Pain:  Vitals:   08/01/18 1303  TempSrc: Oral  PainSc:                  Lewie Loron

## 2018-08-01 NOTE — Anesthesia Procedure Notes (Signed)
Anesthesia Regional Block: Popliteal block   Pre-Anesthetic Checklist: ,, timeout performed, Correct Patient, Correct Site, Correct Laterality, Correct Procedure, Correct Position, site marked, Risks and benefits discussed,  Surgical consent,  Pre-op evaluation,  At surgeon's request and post-op pain management  Laterality: Right  Prep: chloraprep       Needles:  Injection technique: Single-shot  Needle Type: Stimiplex     Needle Length: 10cm  Needle Gauge: 21     Additional Needles:   Procedures:,,,, ultrasound used (permanent image in chart),,,,  Motor weakness within 5 minutes.   Nerve Stimulator or Paresthesia:  Response: 0.5 mA,   Additional Responses:   Narrative:  Start time: 08/01/2018 7:10 AM End time: 08/01/2018 7:15 AM Injection made incrementally with aspirations every 5 mL.  Performed by: Personally  Anesthesiologist: Lewie Loron, MD  Additional Notes: Nerve located and needle positioned with direct ultrasound guidance. Good perineural spread. Patient tolerated well.

## 2018-08-01 NOTE — Transfer of Care (Signed)
Immediate Anesthesia Transfer of Care Note  Patient: Judy Hernandez  Procedure(s) Performed: REPAIR OF RIGHT PILON NONUNION WITH RIA HARVEST OF RIGHT FEMUR (Right )  Patient Location: PACU  Anesthesia Type:GA combined with regional for post-op pain  Level of Consciousness: awake, alert  and oriented  Airway & Oxygen Therapy: Patient Spontanous Breathing and Patient connected to nasal cannula oxygen  Post-op Assessment: Report given to RN and Post -op Vital signs reviewed and stable  Post vital signs: Reviewed and stable  Last Vitals:  Vitals Value Taken Time  BP 124/69 08/01/2018  9:36 AM  Temp 36.5 C 08/01/2018  9:35 AM  Pulse 62 08/01/2018  9:43 AM  Resp 14 08/01/2018  9:43 AM  SpO2 98 % 08/01/2018  9:43 AM  Vitals shown include unvalidated device data.  Last Pain:  Vitals:   08/01/18 0935  TempSrc:   PainSc: 10-Worst pain ever         Complications: No apparent anesthesia complications
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
## 2018-08-02 ENCOUNTER — Encounter (HOSPITAL_COMMUNITY): Payer: Self-pay

## 2018-08-02 DIAGNOSIS — S82871A Displaced pilon fracture of right tibia, initial encounter for closed fracture: Secondary | ICD-10-CM | POA: Diagnosis not present

## 2018-08-02 LAB — CBC
HCT: 32.1 % — ABNORMAL LOW (ref 36.0–46.0)
Hemoglobin: 10.5 g/dL — ABNORMAL LOW (ref 12.0–15.0)
MCH: 32.3 pg (ref 26.0–34.0)
MCHC: 32.7 g/dL (ref 30.0–36.0)
MCV: 98.8 fL (ref 80.0–100.0)
NRBC: 0 % (ref 0.0–0.2)
Platelets: 246 10*3/uL (ref 150–400)
RBC: 3.25 MIL/uL — ABNORMAL LOW (ref 3.87–5.11)
RDW: 13.3 % (ref 11.5–15.5)
WBC: 10.8 10*3/uL — ABNORMAL HIGH (ref 4.0–10.5)

## 2018-08-02 MED ORDER — ASPIRIN 325 MG PO TABS: 325.0000 mg | ORAL_TABLET | Freq: Every day | ORAL | 1 refills | Status: AC

## 2018-08-02 MED ORDER — ACETAMINOPHEN 500 MG PO TABS: 500.0000 mg | ORAL_TABLET | Freq: Two times a day (BID) | ORAL | 0 refills | Status: AC

## 2018-08-02 MED ORDER — OXYCODONE HCL 5 MG PO TABS: 5.0000 mg | ORAL_TABLET | ORAL | 0 refills | Status: DC | PRN

## 2018-08-02 NOTE — Progress Notes (Signed)
Received call from RN. Pt is going home and PT is recommending a RW. Contacted Jermaine at [facility] for DME referral.

## 2018-08-02 NOTE — Progress Notes (Signed)
Orthopaedic Trauma Progress Note  S: Doing well, pain controlled. Nerve block not fully worn off. Excited about putting weight on left leg  O:  Vitals:   08/02/18 0033 08/02/18 0555  BP: 110/67 107/68  Pulse: 73 (!) 57  Resp:    Temp:  98.5 F (36.9 C)  SpO2: 97% 96%    Gen: NAD, AAOx3 RLE: Dressing intact. Unable to actively DF/PF ankle or toes due to block. Warm and well perfused foot  Imaging: Stable postop imaging  Labs:  Results for orders placed or performed during the hospital encounter of 08/01/18 (from the past 24 hour(s))  CBC     Status: Abnormal   Collection Time: 08/02/18  2:41 AM  Result Value Ref Range   WBC 10.8 (H) 4.0 - 10.5 K/uL   RBC 3.25 (L) 3.87 - 5.11 MIL/uL   Hemoglobin 10.5 (L) 12.0 - 15.0 g/dL   HCT 81.1 (L) 57.2 - 62.0 %   MCV 98.8 80.0 - 100.0 fL   MCH 32.3 26.0 - 34.0 pg   MCHC 32.7 30.0 - 36.0 g/dL   RDW 35.5 97.4 - 16.3 %   Platelets 246 150 - 400 K/uL   nRBC 0.0 0.0 - 0.2 %    Assessment: 53 yo female s/p bone grafting of right tibial nonunion   Weightbearing: WBAT LLE in boot, NWB RLE  Insicional and dressing care: Remove dressing tomorrow  Orthopedic device(s):TLSO and boots  CV/Blood loss:Stable at 10.5  Pain management: 1. Oxycodone 5-10 mg 2. Morphine 2 mg 3. Robaxin 750mg  4. Gabapentin 300 mg TID  VTE prophylaxis: ASA 325mg   ID: Ancef x24 hours  Foley/Lines: None  Dispo: Home today  Follow - up plan: 2 weeks   Roby Lofts, MD [facility] [phone] (phone)

## 2018-08-02 NOTE — Discharge Instructions (Signed)
Orthopaedic Trauma Service Discharge Instructions   General Discharge Instructions  WEIGHT BEARING STATUS: Non-weightbearing right lower extremity. Weightbearing as tolerated left lower extremity in the boot  RANGE OF MOTION/ACTIVITY: full unrestricted range of motion  Wound Care: Dressing can be removed on POD #2 (08/03/2018). If no drainage, patient can start showering on POD #3 and leave incision open to air  DVT/PE prophylaxis: Aspirin 325 mg  Diet: as you were eating previously.  Can use over the counter stool softeners and bowel preparations, such as Miralax, to help with bowel movements.  Narcotics can be constipating.  Be sure to drink plenty of fluids  PAIN MEDICATION USE AND EXPECTATIONS  You have likely been given narcotic medications to help control your pain.  After a traumatic event that results in an fracture (broken bone) with or without surgery, it is ok to use narcotic pain medications to help control one's pain.  We understand that everyone responds to pain differently and each individual patient will be evaluated on a regular basis for the continued need for narcotic medications. Ideally, narcotic medication use should last no more than 6-8 weeks (coinciding with fracture healing).   As a patient it is your responsibility as well to monitor narcotic medication use and report the amount and frequency you use these medications when you come to your office visit.   We would also advise that if you are using narcotic medications, you should take a dose prior to therapy to maximize you participation.  IF YOU ARE ON NARCOTIC MEDICATIONS IT IS NOT PERMISSIBLE TO OPERATE A MOTOR VEHICLE (MOTORCYCLE/CAR/TRUCK/MOPED) OR HEAVY MACHINERY DO NOT MIX NARCOTICS WITH OTHER CNS (CENTRAL NERVOUS SYSTEM) DEPRESSANTS SUCH AS ALCOHOL   STOP SMOKING OR USING NICOTINE PRODUCTS!!!!  As discussed nicotine severely impairs your body's ability to heal surgical and traumatic wounds but also  impairs bone healing.  Wounds and bone heal by forming microscopic blood vessels (angiogenesis) and nicotine is a vasoconstrictor (essentially, shrinks blood vessels).  Therefore, if vasoconstriction occurs to these microscopic blood vessels they essentially disappear and are unable to deliver necessary nutrients to the healing tissue.  This is one modifiable factor that you can do to dramatically increase your chances of healing your injury.    (This means no smoking, no nicotine gum, patches, etc)  DO NOT USE NONSTEROIDAL ANTI-INFLAMMATORY DRUGS (NSAID'S)  Using products such as Advil (ibuprofen), Aleve (naproxen), Motrin (ibuprofen) for additional pain control during fracture healing can delay and/or prevent the healing response.  If you would like to take over the counter (OTC) medication, Tylenol (acetaminophen) is ok.  However, some narcotic medications that are given for pain control contain acetaminophen as well. Therefore, you should not exceed more than 4000 mg of tylenol in a day if you do not have liver disease.  Also note that there are may OTC medicines, such as cold medicines and allergy medicines that my contain tylenol as well.  If you have any questions about medications and/or interactions please ask your doctor/PA or your pharmacist.      ICE AND ELEVATE INJURED/OPERATIVE EXTREMITY  Using ice and elevating the injured extremity above your heart can help with swelling and pain control.  Icing in a pulsatile fashion, such as 20 minutes on and 20 minutes off, can be followed.    Do not place ice directly on skin. Make sure there is a barrier between to skin and the ice pack.    Using frozen items such as frozen peas works well  as the conform nicely to the are that needs to be iced.  USE AN ACE WRAP OR TED HOSE FOR SWELLING CONTROL  In addition to icing and elevation, Ace wraps or TED hose are used to help limit and resolve swelling.  It is recommended to use Ace wraps or TED hose until  you are informed to stop.    When using Ace Wraps start the wrapping distally (farthest away from the body) and wrap proximally (closer to the body)   Example: If you had surgery on your leg or thing and you do not have a splint on, start the ace wrap at the toes and work your way up to the thigh        If you had surgery on your upper extremity and do not have a splint on, start the ace wrap at your fingers and work your way up to the upper arm    IF YOU ARE IN A CAM BOOT (BLACK BOOT)  You may remove boot periodically. Perform daily dressing changes as noted below.  Wash the liner of the boot regularly and wear a sock when wearing the boot. It is recommended that you sleep in the boot until told otherwise  CALL THE OFFICE WITH ANY QUESTIONS OR CONCERNS: [phone]      Discharge Wound Care Instructions  Do NOT apply any ointments, solutions or lotions to pin sites or surgical wounds.  These prevent needed drainage and even though solutions like hydrogen peroxide kill bacteria, they also damage cells lining the pin sites that help fight infection.  Applying lotions or ointments can keep the wounds moist and can cause them to breakdown and open up as well. This can increase the risk for infection. When in doubt call the office.  Surgical incisions should be dressed daily.  If any drainage is noted, use one layer of adaptic, then gauze, Kerlix, and an ace wrap.  Once the incision is completely dry and without drainage, it may be left open to air out.  Showering may begin 36-48 hours later.  Cleaning gently with soap and water.  Traumatic wounds should be dressed daily as well.    One layer of adaptic, gauze, Kerlix, then ace wrap.  The adaptic can be discontinued once the draining has ceased    If you have a wet to dry dressing: wet the gauze with saline the squeeze as much saline out so the gauze is moist (not soaking wet), place moistened gauze over wound, then place a dry gauze over  the moist one, followed by Kerlix wrap, then ace wrap.
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
## 2018-08-02 NOTE — Progress Notes (Signed)
Pt wants to go home, ambulating with walker to the hall, maint NWB to RLE. Discharge instructions given to pt, verbalized understanding. Discharged home accompanied by a friend.

## 2018-08-04 ENCOUNTER — Encounter (HOSPITAL_COMMUNITY): Payer: Self-pay | Admitting: Student

## 2018-10-21 ENCOUNTER — Other Ambulatory Visit: Payer: Self-pay | Admitting: Physician Assistant

## 2018-10-21 DIAGNOSIS — S82301M Unspecified fracture of lower end of right tibia, subsequent encounter for open fracture type I or II with nonunion: Secondary | ICD-10-CM
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
## 2018-10-24 ENCOUNTER — Other Ambulatory Visit (HOSPITAL_COMMUNITY)
Admission: RE | Admit: 2018-10-24 | Discharge: 2018-10-24 | Disposition: A | Discharge status: Home / Self Care | Payer: Medicaid - Out of State | Source: Ambulatory Visit | Attending: Student | Admitting: Student

## 2018-10-24 ENCOUNTER — Encounter (HOSPITAL_COMMUNITY): Payer: Self-pay

## 2018-10-24 ENCOUNTER — Other Ambulatory Visit: Payer: Self-pay

## 2018-10-24 ENCOUNTER — Encounter (HOSPITAL_COMMUNITY)
Admission: RE | Admit: 2018-10-24 | Discharge: 2018-10-24 | Disposition: A | Discharge status: Home / Self Care | Payer: Medicaid - Out of State | Source: Ambulatory Visit | Attending: Student | Admitting: Student

## 2018-10-24 DIAGNOSIS — Z1159 Encounter for screening for other viral diseases: Secondary | ICD-10-CM | POA: Diagnosis not present

## 2018-10-24 DIAGNOSIS — Z01812 Encounter for preprocedural laboratory examination: Secondary | ICD-10-CM | POA: Insufficient documentation

## 2018-10-24 HISTORY — DX: Heartburn: R12

## 2018-10-24 HISTORY — DX: Headache, unspecified: R51.9

## 2018-10-24 HISTORY — DX: Polyneuropathy, unspecified: G62.9

## 2018-10-24 HISTORY — DX: Unspecified injury of head, initial encounter: S09.90XA

## 2018-10-24 HISTORY — DX: Post-traumatic stress disorder, unspecified: F43.10

## 2018-10-24 LAB — SURGICAL PCR SCREEN
MRSA, PCR: NEGATIVE
Staphylococcus aureus: NEGATIVE

## 2018-10-24 LAB — CBC
HCT: 40 % (ref 36.0–46.0)
Hemoglobin: 12.3 g/dL (ref 12.0–15.0)
MCH: 30.9 pg (ref 26.0–34.0)
MCHC: 30.8 g/dL (ref 30.0–36.0)
MCV: 100.5 fL — ABNORMAL HIGH (ref 80.0–100.0)
Platelets: 273 10*3/uL (ref 150–400)
RBC: 3.98 MIL/uL (ref 3.87–5.11)
RDW: 13.9 % (ref 11.5–15.5)
WBC: 6.8 10*3/uL (ref 4.0–10.5)
nRBC: 0 % (ref 0.0–0.2)

## 2018-10-24 NOTE — Progress Notes (Signed)
PCP - Windell Norfolk, FNP; [facility] in [place].  Cardiologist - no   Chest x-ray - na  EKG - 10/29/2018  Stress Test - no  ECHO - no  Cardiac Cath - no  Sleep Study - na CPAP - na  LABS- CBC, PCR- per Dr  ASA- na  ERAS- no orders  HA1C-na Fasting Blood Sugar - na Checks Blood Sugar ___na__ times a day  Anesthesia-na  Pt denies having chest pain, sob, or fever at this time. All instructions explained to the pt, with a verbal understanding of the material. Pt agrees to go over the instructions while at home for a better understanding. The opportunity to ask questions was provided.  Patient denies that she or her family has experienced any of the following: Cough Fever >100.4 Runny Nose Sore Throat Difficulty breathing/ shortness of breath Travel in past 14 days- no  Judy Hernandez will have COVID test this afternoon, she reports that she will be able to quarantine until Tuesdays surgery.

## 2018-10-24 NOTE — Pre-Procedure Instructions (Signed)
Judy Hernandez  10/24/2018     Your procedure is scheduled on Wednesday, May 27.   Report to [facility], Main Entrance or Entrance "A" at 5:30 A.M.               Your surgery or procedure is scheduled for 7:30 AM   Call this number if you have problems the morning of surgery: [phone]  This is the number for the Pre- Surgical Desk.     Remember:  Do not eat or drink after midnight Tuesday, May 16.   Take these medicines the morning of surgery with A SIP OF WATER : Citalopram Gabapentin  If needed: Methocarbamol Oxycodone- Acetaminophen or Tramadol  STOP taking Aspirin, Aspirin Products (Goody Powder, Excedrin Migraine), Ibuprofen (Advil), Naproxen (Aleve), Vitamins and Herbal Products (ie Fish Oil).  Special instructions  [place]- Preparing For Surgery  Before surgery, you can play an important role. Because skin is not sterile, your skin needs to be as free of germs as possible. You can reduce the number of germs on your skin by washing with CHG (chlorahexidine gluconate) Soap before surgery.  CHG is an antiseptic cleaner which kills germs and bonds with the skin to continue killing germs even after washing.    Oral Hygiene is also important to reduce your risk of infection.  Remember - BRUSH YOUR TEETH THE MORNING OF SURGERY WITH YOUR REGULAR TOOTHPASTE  Please do not use if you have an allergy to CHG or antibacterial soaps. If your skin becomes reddened/irritated stop using the CHG.  Do not shave (including legs and underarms) for at least 48 hours prior to first CHG shower. It is OK to shave your face.  Please follow these instructions carefully.   1. Shower the NIGHT BEFORE SURGERY and the MORNING OF SURGERY with CHG.   2. If you chose to wash your hair, wash your hair first as usual with your normal shampoo.  3. After you shampoo, wash your face and private area with the soap you use at home, then rinse your hair and body thoroughly to remove the  shampoo and soap.  4. Use CHG as you would any other liquid soap. You can apply CHG directly to the skin and wash gently with a scrungie or a clean washcloth.   5. Apply the CHG Soap to your body ONLY FROM THE NECK DOWN.  Do not use on open wounds or open sores. Avoid contact with your eyes, ears, mouth and genitals (private parts).   6. Wash thoroughly, paying special attention to the area where your surgery will be performed.  7. Thoroughly rinse your body with warm water from the neck down.  8. DO NOT shower/wash with your normal soap after using and rinsing off the CHG Soap.  9. Pat yourself dry with a CLEAN TOWEL.  10. Wear CLEAN PAJAMAS to bed the night before surgery, wear comfortable clothes the morning of surgery  11. Place CLEAN SHEETS on your bed the night of your first shower and DO NOT SLEEP WITH PETS. Day of Surgery Shower as Instructed Abov Do not wear lotions, powders, or perfumes, or deodorant. Please wear clean clothes to the hospital/surgery center.   Remember to brush your teeth WITH YOUR REGULAR TOOTHPASTE.  Do not wear jewelry, make-up or nail polish. >>>>>>Do not shave 48 hours prior to surgery. <<<<<<.  Do not bring valuables to the hospital.  [facility] is not responsible for any belongings or valuables.  Contacts,  dentures or bridgework may not be worn into surgery.  Leave your suitcase in the car.  After surgery it may be brought to your room.  For patients admitted to the hospital, discharge time will be determined by your treatment team.  Please read over the following fact sheets that you were given: 10 Things YOU CAN DO TO MANAGE YOUR HEALTH AT HOME, Coughing and Deep Breathing, Surgical Site Infections

## 2018-10-25 LAB — NOVEL CORONAVIRUS, NAA (HOSP ORDER, SEND-OUT TO REF LAB; TAT 18-24 HRS): SARS-CoV-2, NAA: NOT DETECTED

## 2018-10-29 ENCOUNTER — Encounter (HOSPITAL_COMMUNITY): Payer: Self-pay

## 2018-10-29 ENCOUNTER — Ambulatory Visit (HOSPITAL_COMMUNITY): Payer: Medicaid - Out of State | Admitting: Anesthesiology

## 2018-10-29 ENCOUNTER — Ambulatory Visit (HOSPITAL_COMMUNITY): Payer: Medicaid - Out of State

## 2018-10-29 ENCOUNTER — Encounter (HOSPITAL_COMMUNITY)
Admission: AD | Disposition: A | Discharge status: Home / Self Care | Payer: Self-pay | Source: Home / Self Care | Attending: Student

## 2018-10-29 ENCOUNTER — Observation Stay (HOSPITAL_COMMUNITY): Payer: Medicaid - Out of State

## 2018-10-29 ENCOUNTER — Other Ambulatory Visit: Payer: Self-pay

## 2018-10-29 ENCOUNTER — Inpatient Hospital Stay (HOSPITAL_COMMUNITY)
Admission: AD | Admit: 2018-10-29 | Discharge: 2018-10-31 | DRG: 493 | Disposition: A | Discharge status: Home / Self Care | Payer: Medicaid - Out of State | Attending: Student | Admitting: Student

## 2018-10-29 DIAGNOSIS — T148XXA Other injury of unspecified body region, initial encounter: Secondary | ICD-10-CM

## 2018-10-29 DIAGNOSIS — S82401N Unspecified fracture of shaft of right fibula, subsequent encounter for open fracture type IIIA, IIIB, or IIIC with nonunion: Secondary | ICD-10-CM | POA: Diagnosis present

## 2018-10-29 DIAGNOSIS — Z87891 Personal history of nicotine dependence: Secondary | ICD-10-CM

## 2018-10-29 DIAGNOSIS — D62 Acute posthemorrhagic anemia: Secondary | ICD-10-CM | POA: Diagnosis not present

## 2018-10-29 DIAGNOSIS — S82301M Unspecified fracture of lower end of right tibia, subsequent encounter for open fracture type I or II with nonunion: Secondary | ICD-10-CM

## 2018-10-29 DIAGNOSIS — S82201N Unspecified fracture of shaft of right tibia, subsequent encounter for open fracture type IIIA, IIIB, or IIIC with nonunion: Secondary | ICD-10-CM | POA: Diagnosis present

## 2018-10-29 DIAGNOSIS — E559 Vitamin D deficiency, unspecified: Secondary | ICD-10-CM | POA: Diagnosis present

## 2018-10-29 DIAGNOSIS — S82871N Displaced pilon fracture of right tibia, subsequent encounter for open fracture type IIIA, IIIB, or IIIC with nonunion: Principal | ICD-10-CM

## 2018-10-29 DIAGNOSIS — S32031D Stable burst fracture of third lumbar vertebra, subsequent encounter for fracture with routine healing: Secondary | ICD-10-CM

## 2018-10-29 DIAGNOSIS — Z419 Encounter for procedure for purposes other than remedying health state, unspecified: Secondary | ICD-10-CM

## 2018-10-29 DIAGNOSIS — S82831M Other fracture of upper and lower end of right fibula, subsequent encounter for open fracture type I or II with nonunion: Secondary | ICD-10-CM

## 2018-10-29 DIAGNOSIS — E039 Hypothyroidism, unspecified: Secondary | ICD-10-CM | POA: Diagnosis present

## 2018-10-29 HISTORY — PX: TIBIA FRACTURE SURGERY: SHX806

## 2018-10-29 HISTORY — PX: ORIF TIBIA FRACTURE: SHX5416

## 2018-10-29 LAB — MAGNESIUM: Magnesium: 2 mg/dL (ref 1.7–2.4)

## 2018-10-29 LAB — C-REACTIVE PROTEIN: CRP: <0.8 mg/dL (ref <1.0)

## 2018-10-29 LAB — SEDIMENTATION RATE: Sed Rate: 11 mm/hr (ref 0–22)

## 2018-10-29 LAB — TSH: TSH: 6.694 u[IU]/mL — ABNORMAL HIGH (ref 0.350–4.500)

## 2018-10-29 LAB — PREALBUMIN: Prealbumin: 26.9 mg/dL (ref 18–38)

## 2018-10-29 LAB — ALBUMIN: Albumin: 3.4 g/dL — ABNORMAL LOW (ref 3.5–5.0)

## 2018-10-29 LAB — PHOSPHORUS: Phosphorus: 3.9 mg/dL (ref 2.5–4.6)

## 2018-10-29 LAB — HEMOGLOBIN A1C
Hgb A1c MFr Bld: 5.6 % (ref 4.8–5.6)
Mean Plasma Glucose: 114.02 mg/dL

## 2018-10-29 SURGERY — OPEN REDUCTION INTERNAL FIXATION (ORIF) TIBIA FRACTURE
Anesthesia: General | Site: Leg Lower | Laterality: Right

## 2018-10-29 MED ORDER — HYDROMORPHONE HCL 1 MG/ML IJ SOLN
0.2500 mg | INTRAMUSCULAR | Status: DC | PRN
  Administered 2018-10-29: 11:00:00 0.5 mg via INTRAVENOUS

## 2018-10-29 MED ORDER — FENTANYL CITRATE (PF) 250 MCG/5ML IJ SOLN
INTRAMUSCULAR | Status: AC
  Filled 2018-10-29: qty 5

## 2018-10-29 MED ORDER — DOCUSATE SODIUM 100 MG PO CAPS
100.0000 mg | ORAL_CAPSULE | Freq: Every day | ORAL | Status: DC
  Administered 2018-10-29 – 2018-10-31 (×3): 100 mg via ORAL
  Filled 2018-10-29 (×3): qty 1

## 2018-10-29 MED ORDER — ROCURONIUM BROMIDE 100 MG/10ML IV SOLN
INTRAVENOUS | Status: DC | PRN
  Administered 2018-10-29: 50 mg via INTRAVENOUS
  Administered 2018-10-29 (×2): 10 mg via INTRAVENOUS

## 2018-10-29 MED ORDER — LACTATED RINGERS IV SOLN
INTRAVENOUS | Status: DC | PRN
  Administered 2018-10-29 (×2): via INTRAVENOUS

## 2018-10-29 MED ORDER — OXYCODONE HCL 5 MG PO TABS
10.0000 mg | ORAL_TABLET | ORAL | Status: DC | PRN
  Administered 2018-10-29 – 2018-10-31 (×4): 10 mg via ORAL
  Filled 2018-10-29 (×4): qty 2

## 2018-10-29 MED ORDER — PROPOFOL 10 MG/ML IV BOLUS
INTRAVENOUS | Status: AC
  Filled 2018-10-29: qty 20

## 2018-10-29 MED ORDER — CHLORHEXIDINE GLUCONATE 4 % EX LIQD: 60.0000 mL | Freq: Once | CUTANEOUS | Status: DC

## 2018-10-29 MED ORDER — HYDROMORPHONE HCL 1 MG/ML IJ SOLN
1.0000 mg | INTRAMUSCULAR | Status: DC | PRN
  Administered 2018-10-29 – 2018-10-31 (×11): 1 mg via INTRAVENOUS
  Filled 2018-10-29 (×11): qty 1

## 2018-10-29 MED ORDER — MORPHINE SULFATE (PF) 2 MG/ML IV SOLN
2.0000 mg | INTRAVENOUS | Status: DC | PRN
  Administered 2018-10-29: 2 mg via INTRAVENOUS
  Filled 2018-10-29: qty 1

## 2018-10-29 MED ORDER — OXYCODONE HCL 5 MG PO TABS
5.0000 mg | ORAL_TABLET | ORAL | Status: DC | PRN
  Administered 2018-10-29 (×2): 10 mg via ORAL
  Filled 2018-10-29 (×2): qty 2

## 2018-10-29 MED ORDER — ACETAMINOPHEN 325 MG PO TABS
650.0000 mg | ORAL_TABLET | Freq: Four times a day (QID) | ORAL | Status: DC
  Administered 2018-10-29 – 2018-10-30 (×4): 650 mg via ORAL
  Filled 2018-10-29 (×5): qty 2

## 2018-10-29 MED ORDER — KETOROLAC TROMETHAMINE 30 MG/ML IJ SOLN
INTRAMUSCULAR | Status: AC
  Administered 2018-10-29: 30 mg via INTRAVENOUS
  Filled 2018-10-29: qty 1

## 2018-10-29 MED ORDER — SUCCINYLCHOLINE CHLORIDE 20 MG/ML IJ SOLN
INTRAMUSCULAR | Status: DC | PRN
  Administered 2018-10-29: 100 mg via INTRAVENOUS

## 2018-10-29 MED ORDER — LIDOCAINE 2% (20 MG/ML) 5 ML SYRINGE
INTRAMUSCULAR | Status: AC
  Filled 2018-10-29: qty 5

## 2018-10-29 MED ORDER — DEXAMETHASONE SODIUM PHOSPHATE 10 MG/ML IJ SOLN
INTRAMUSCULAR | Status: DC | PRN
  Administered 2018-10-29: 5 mg via INTRAVENOUS

## 2018-10-29 MED ORDER — MIDAZOLAM HCL 2 MG/2ML IJ SOLN
INTRAMUSCULAR | Status: AC
  Filled 2018-10-29: qty 2

## 2018-10-29 MED ORDER — METHOCARBAMOL 500 MG PO TABS
500.0000 mg | ORAL_TABLET | Freq: Four times a day (QID) | ORAL | Status: DC | PRN
  Administered 2018-10-29 – 2018-10-31 (×6): 500 mg via ORAL
  Filled 2018-10-29 (×6): qty 1

## 2018-10-29 MED ORDER — ASPIRIN 325 MG PO TABS
325.0000 mg | ORAL_TABLET | Freq: Every day | ORAL | Status: DC
  Administered 2018-10-30 – 2018-10-31 (×2): 325 mg via ORAL
  Filled 2018-10-29 (×2): qty 1

## 2018-10-29 MED ORDER — VANCOMYCIN HCL 1000 MG IV SOLR
INTRAVENOUS | Status: AC
  Filled 2018-10-29: qty 1000

## 2018-10-29 MED ORDER — FENTANYL CITRATE (PF) 100 MCG/2ML IJ SOLN
INTRAMUSCULAR | Status: DC | PRN
  Administered 2018-10-29: 100 ug via INTRAVENOUS
  Administered 2018-10-29 (×3): 50 ug via INTRAVENOUS
  Administered 2018-10-29: 100 ug via INTRAVENOUS
  Administered 2018-10-29 (×2): 50 ug via INTRAVENOUS
  Administered 2018-10-29: 100 ug via INTRAVENOUS
  Administered 2018-10-29 (×2): 50 ug via INTRAVENOUS
  Administered 2018-10-29: 100 ug via INTRAVENOUS

## 2018-10-29 MED ORDER — PROMETHAZINE HCL 25 MG/ML IJ SOLN: 6.2500 mg | INTRAMUSCULAR | Status: DC | PRN

## 2018-10-29 MED ORDER — EPHEDRINE SULFATE 50 MG/ML IJ SOLN
INTRAMUSCULAR | Status: DC | PRN
  Administered 2018-10-29 (×3): 10 mg via INTRAVENOUS

## 2018-10-29 MED ORDER — CEFAZOLIN SODIUM-DEXTROSE 2-4 GM/100ML-% IV SOLN
INTRAVENOUS | Status: AC
  Filled 2018-10-29: qty 100

## 2018-10-29 MED ORDER — LIDOCAINE HCL (CARDIAC) PF 100 MG/5ML IV SOSY
PREFILLED_SYRINGE | INTRAVENOUS | Status: DC | PRN
  Administered 2018-10-29: 100 mg via INTRAVENOUS

## 2018-10-29 MED ORDER — KETOROLAC TROMETHAMINE 30 MG/ML IJ SOLN
30.0000 mg | Freq: Once | INTRAMUSCULAR | Status: AC | PRN
  Administered 2018-10-29: 30 mg via INTRAVENOUS

## 2018-10-29 MED ORDER — SUCCINYLCHOLINE CHLORIDE 200 MG/10ML IV SOSY
PREFILLED_SYRINGE | INTRAVENOUS | Status: AC
  Filled 2018-10-29: qty 10

## 2018-10-29 MED ORDER — OXYCODONE HCL 5 MG/5ML PO SOLN: 5.0000 mg | Freq: Once | ORAL | Status: DC | PRN

## 2018-10-29 MED ORDER — ONDANSETRON HCL 4 MG/2ML IJ SOLN
INTRAMUSCULAR | Status: AC
  Filled 2018-10-29: qty 2

## 2018-10-29 MED ORDER — OXYCODONE HCL 5 MG PO TABS: 5.0000 mg | ORAL_TABLET | Freq: Once | ORAL | Status: DC | PRN

## 2018-10-29 MED ORDER — ACETAMINOPHEN 10 MG/ML IV SOLN
INTRAVENOUS | Status: AC
  Filled 2018-10-29: qty 100

## 2018-10-29 MED ORDER — CEFAZOLIN SODIUM-DEXTROSE 2-4 GM/100ML-% IV SOLN
2.0000 g | Freq: Three times a day (TID) | INTRAVENOUS | Status: AC
  Administered 2018-10-29 – 2018-10-30 (×3): 2 g via INTRAVENOUS
  Filled 2018-10-29 (×3): qty 100

## 2018-10-29 MED ORDER — SUGAMMADEX SODIUM 200 MG/2ML IV SOLN
INTRAVENOUS | Status: DC | PRN
  Administered 2018-10-29: 200 mg via INTRAVENOUS

## 2018-10-29 MED ORDER — TOBRAMYCIN SULFATE 1.2 G IJ SOLR
INTRAMUSCULAR | Status: AC
  Filled 2018-10-29: qty 1.2

## 2018-10-29 MED ORDER — KETAMINE HCL 10 MG/ML IJ SOLN
INTRAMUSCULAR | Status: DC | PRN
  Administered 2018-10-29: 30 mg via INTRAVENOUS
  Administered 2018-10-29: 20 mg via INTRAVENOUS

## 2018-10-29 MED ORDER — KETAMINE HCL 50 MG/5ML IJ SOSY
PREFILLED_SYRINGE | INTRAMUSCULAR | Status: AC
  Filled 2018-10-29: qty 5

## 2018-10-29 MED ORDER — CITALOPRAM HYDROBROMIDE 20 MG PO TABS
20.0000 mg | ORAL_TABLET | Freq: Every day | ORAL | Status: DC
  Administered 2018-10-30 – 2018-10-31 (×2): 20 mg via ORAL
  Filled 2018-10-29 (×2): qty 1

## 2018-10-29 MED ORDER — 0.9 % SODIUM CHLORIDE (POUR BTL) OPTIME
TOPICAL | Status: DC | PRN
  Administered 2018-10-29: 1000 mL

## 2018-10-29 MED ORDER — ENOXAPARIN SODIUM 40 MG/0.4ML ~~LOC~~ SOLN: 40.0000 mg | SUBCUTANEOUS | Status: DC

## 2018-10-29 MED ORDER — CEFAZOLIN SODIUM-DEXTROSE 2-4 GM/100ML-% IV SOLN
2.0000 g | INTRAVENOUS | Status: AC
  Administered 2018-10-29: 2 g via INTRAVENOUS

## 2018-10-29 MED ORDER — VANCOMYCIN HCL 1000 MG IV SOLR
INTRAVENOUS | Status: DC | PRN
  Administered 2018-10-29: 1000 mg

## 2018-10-29 MED ORDER — HYDROMORPHONE HCL 1 MG/ML IJ SOLN
INTRAMUSCULAR | Status: AC
  Administered 2018-10-29: 0.5 mg via INTRAVENOUS
  Filled 2018-10-29: qty 1

## 2018-10-29 MED ORDER — ONDANSETRON HCL 4 MG/2ML IJ SOLN
INTRAMUSCULAR | Status: DC | PRN
  Administered 2018-10-29: 4 mg via INTRAVENOUS

## 2018-10-29 MED ORDER — ACETAMINOPHEN 10 MG/ML IV SOLN
INTRAVENOUS | Status: DC | PRN
  Administered 2018-10-29: 1000 mg via INTRAVENOUS

## 2018-10-29 MED ORDER — ROPIVACAINE HCL 7.5 MG/ML IJ SOLN
INTRAMUSCULAR | Status: DC | PRN
  Administered 2018-10-29: 20 mL via PERINEURAL

## 2018-10-29 MED ORDER — TOBRAMYCIN SULFATE 1.2 G IJ SOLR
INTRAMUSCULAR | Status: DC | PRN
  Administered 2018-10-29: 1.2 g via TOPICAL

## 2018-10-29 MED ORDER — POVIDONE-IODINE 10 % EX SWAB: 2.0000 "application " | Freq: Once | CUTANEOUS | Status: DC

## 2018-10-29 MED ORDER — PROPOFOL 10 MG/ML IV BOLUS
INTRAVENOUS | Status: DC | PRN
  Administered 2018-10-29: 130 mg via INTRAVENOUS

## 2018-10-29 MED ORDER — GABAPENTIN 300 MG PO CAPS
300.0000 mg | ORAL_CAPSULE | Freq: Three times a day (TID) | ORAL | Status: DC
  Administered 2018-10-29 (×2): 300 mg via ORAL
  Filled 2018-10-29 (×2): qty 1

## 2018-10-29 MED ORDER — MIDAZOLAM HCL 5 MG/5ML IJ SOLN
INTRAMUSCULAR | Status: DC | PRN
  Administered 2018-10-29: 2 mg via INTRAVENOUS

## 2018-10-29 SURGICAL SUPPLY — 76 items
BANDAGE ACE 4X5 VEL STRL LF (GAUZE/BANDAGES/DRESSINGS) ×2 IMPLANT
BANDAGE ACE 6X5 VEL STRL LF (GAUZE/BANDAGES/DRESSINGS) IMPLANT
BANDAGE ESMARK 6X9 LF (GAUZE/BANDAGES/DRESSINGS) ×1 IMPLANT
BIT DRILL 2.5 X LONG (BIT) ×1
BIT DRILL LCP QC 2X140 (BIT) ×2 IMPLANT
BIT DRILL X LONG 2.5 (BIT) ×1 IMPLANT
BLADE CLIPPER SURG (BLADE) IMPLANT
BNDG COHESIVE 4X5 TAN STRL (GAUZE/BANDAGES/DRESSINGS) IMPLANT
BNDG ELASTIC 6X15 VLCR STRL LF (GAUZE/BANDAGES/DRESSINGS) ×2 IMPLANT
BNDG ESMARK 6X9 LF (GAUZE/BANDAGES/DRESSINGS) ×2
BNDG GAUZE ELAST 4 BULKY (GAUZE/BANDAGES/DRESSINGS) IMPLANT
BONE VIVIGEN FORMABLE 5.4CC (Bone Implant) ×2 IMPLANT
BRUSH SCRUB SURG 4.25 DISP (MISCELLANEOUS) ×4 IMPLANT
CHLORAPREP W/TINT 26ML (MISCELLANEOUS) ×2 IMPLANT
COVER MAYO STAND STRL (DRAPES) ×2 IMPLANT
COVER WAND RF STERILE (DRAPES) IMPLANT
DRAPE C-ARM 42X72 X-RAY (DRAPES) ×2 IMPLANT
DRAPE C-ARMOR (DRAPES) ×2 IMPLANT
DRAPE HALF SHEET 40X57 (DRAPES) ×4 IMPLANT
DRAPE INCISE IOBAN 66X45 STRL (DRAPES) ×2 IMPLANT
DRAPE U-SHAPE 47X51 STRL (DRAPES) ×2 IMPLANT
DRILL BIT X LONG 2.5 (BIT) ×1
DRSG ADAPTIC 3X8 NADH LF (GAUZE/BANDAGES/DRESSINGS) IMPLANT
DRSG PAD ABDOMINAL 8X10 ST (GAUZE/BANDAGES/DRESSINGS) IMPLANT
DRSG VAC ATS MED SENSATRAC (GAUZE/BANDAGES/DRESSINGS) ×2 IMPLANT
ELECT REM PT RETURN 9FT ADLT (ELECTROSURGICAL) ×2
ELECTRODE REM PT RTRN 9FT ADLT (ELECTROSURGICAL) ×1 IMPLANT
GAUZE SPONGE 4X4 12PLY STRL (GAUZE/BANDAGES/DRESSINGS) ×2 IMPLANT
GLOVE BIO SURGEON STRL SZ 6.5 (GLOVE) ×6 IMPLANT
GLOVE BIO SURGEON STRL SZ7.5 (GLOVE) ×8 IMPLANT
GLOVE BIOGEL PI IND STRL 6.5 (GLOVE) ×1 IMPLANT
GLOVE BIOGEL PI IND STRL 7.5 (GLOVE) ×1 IMPLANT
GLOVE BIOGEL PI INDICATOR 6.5 (GLOVE) ×1
GLOVE BIOGEL PI INDICATOR 7.5 (GLOVE) ×1
GLOVE PROGUARD SZ 7 1/2 (GLOVE) ×2 IMPLANT
GOWN STRL REUS W/ TWL LRG LVL3 (GOWN DISPOSABLE) ×2 IMPLANT
GOWN STRL REUS W/TWL LRG LVL3 (GOWN DISPOSABLE) ×2
KIT BASIN OR (CUSTOM PROCEDURE TRAY) ×2 IMPLANT
KIT TURNOVER KIT B (KITS) ×2 IMPLANT
MANIFOLD NEPTUNE II (INSTRUMENTS) ×2 IMPLANT
NS IRRIG 1000ML POUR BTL (IV SOLUTION) ×2 IMPLANT
PACK TOTAL JOINT (CUSTOM PROCEDURE TRAY) ×2 IMPLANT
PAD ARMBOARD 7.5X6 YLW CONV (MISCELLANEOUS) ×4 IMPLANT
PAD CAST 4YDX4 CTTN HI CHSV (CAST SUPPLIES) ×1 IMPLANT
PADDING CAST COTTON 4X4 STRL (CAST SUPPLIES) ×1
PADDING CAST COTTON 6X4 STRL (CAST SUPPLIES) ×2 IMPLANT
PLATE MEDIAL HOLE 8 (Plate) ×2 IMPLANT
SCREW BONE CORTEX 3.5 46MM (Screw) ×2 IMPLANT
SCREW CORTEX 3.5X50MM LP (Screw) ×2 IMPLANT
SCREW CORTEX LOW PRO 3.5X28 (Screw) ×6 IMPLANT
SCREW CORTEX PROFILE 2.5X26 (Screw) ×4 IMPLANT
SCREW LOCKING VA 2.7X40MM (Screw) ×2 IMPLANT
SCREW LOCKING VA 2.7X46 (Screw) ×6 IMPLANT
SCREW LOCKING VA 2.7X48 (Screw) ×4 IMPLANT
SCREW LOW PROFILE 3.5X36MM (Screw) ×2 IMPLANT
SPONGE LAP 18X18 RF (DISPOSABLE) IMPLANT
STAPLER VISISTAT 35W (STAPLE) ×2 IMPLANT
STOCKINETTE IMPERVIOUS LG (DRAPES) IMPLANT
SUCTION FRAZIER HANDLE 10FR (MISCELLANEOUS) ×1
SUCTION TUBE FRAZIER 10FR DISP (MISCELLANEOUS) ×1 IMPLANT
SUT ETHILON 3 0 PS 1 (SUTURE) IMPLANT
SUT MNCRL AB 3-0 PS2 18 (SUTURE) IMPLANT
SUT PROLENE 0 CT (SUTURE) IMPLANT
SUT VIC AB 0 CT1 27 (SUTURE)
SUT VIC AB 0 CT1 27XBRD ANBCTR (SUTURE) IMPLANT
SUT VIC AB 1 CT1 27 (SUTURE)
SUT VIC AB 1 CT1 27XBRD ANBCTR (SUTURE) IMPLANT
SUT VIC AB 2-0 CT1 27 (SUTURE)
SUT VIC AB 2-0 CT1 TAPERPNT 27 (SUTURE) IMPLANT
SYR CONTROL 10ML LL (SYRINGE) ×2 IMPLANT
TOWEL OR 17X24 6PK STRL BLUE (TOWEL DISPOSABLE) ×2 IMPLANT
TOWEL OR 17X26 10 PK STRL BLUE (TOWEL DISPOSABLE) ×4 IMPLANT
TRAY FOLEY MTR SLVR 16FR STAT (SET/KITS/TRAYS/PACK) IMPLANT
TUBE CONNECTING 12X1/4 (SUCTIONS) ×2 IMPLANT
WATER STERILE IRR 1000ML POUR (IV SOLUTION) ×2 IMPLANT
YANKAUER SUCT BULB TIP NO VENT (SUCTIONS) ×2 IMPLANT

## 2018-10-29 NOTE — Anesthesia Postprocedure Evaluation (Signed)
Anesthesia Post Note  Patient: Judy Hernandez  Procedure(s) Performed: REPAIR DISTAL TIBIA NONUNION WITH HARDWARE REMOVAL WITH WOUND VAC PLACEMENT (Right Leg Lower)     Patient location during evaluation: PACU Anesthesia Type: General Level of consciousness: awake and alert Pain management: pain level controlled Vital Signs Assessment: post-procedure vital signs reviewed and stable Respiratory status: spontaneous breathing, nonlabored ventilation, respiratory function stable and patient connected to nasal cannula oxygen Cardiovascular status: blood pressure returned to baseline and stable Postop Assessment: no apparent nausea or vomiting Anesthetic complications: no    Last Vitals:  Vitals:   10/29/18 1120 10/29/18 1125  BP: (!) 116/104 135/75  Pulse: 69 65  Resp: 17 (!) 21  Temp:    SpO2: 100% 100%    Last Pain:  Vitals:   10/29/18 1125  TempSrc:   PainSc: 10-Worst pain ever                 Kishawn Pickar S

## 2018-10-29 NOTE — Anesthesia Procedure Notes (Signed)
Procedure Name: Intubation Date/Time: 10/29/2018 7:36 AM Performed by: Janey Petron T, CRNA Pre-anesthesia Checklist: Patient identified, Emergency Drugs available, Suction available and Patient being monitored Patient Re-evaluated:Patient Re-evaluated prior to induction Oxygen Delivery Method: Circle system utilized Preoxygenation: Pre-oxygenation with 100% oxygen Induction Type: IV induction and Rapid sequence Laryngoscope Size: Miller and 2 Grade View: Grade I Tube type: Oral Tube size: 7.5 mm Number of attempts: 1 Airway Equipment and Method: Patient positioned with wedge pillow and Stylet Placement Confirmation: ETT inserted through vocal cords under direct vision,  positive ETCO2 and breath sounds checked- equal and bilateral Secured at: 21 cm Tube secured with: Tape Dental Injury: Teeth and Oropharynx as per pre-operative assessment

## 2018-10-29 NOTE — Anesthesia Procedure Notes (Signed)
Anesthesia Procedure Image    

## 2018-10-29 NOTE — Anesthesia Preprocedure Evaluation (Signed)
Anesthesia Evaluation  Patient identified by MRN, date of birth, ID band Patient awake    Reviewed: Allergy & Precautions, NPO status , Patient's Chart, lab work & pertinent test results  Airway Mallampati: II  TM Distance: >3 FB Neck ROM: Full    Dental no notable dental hx.    Pulmonary neg pulmonary ROS, former smoker,    Pulmonary exam normal breath sounds clear to auscultation       Cardiovascular negative cardio ROS Normal cardiovascular exam Rhythm:Regular Rate:Normal     Neuro/Psych Anxiety negative neurological ROS     GI/Hepatic negative GI ROS, Neg liver ROS,   Endo/Other  negative endocrine ROS  Renal/GU negative Renal ROS  negative genitourinary   Musculoskeletal negative musculoskeletal ROS (+)   Abdominal   Peds negative pediatric ROS (+)  Hematology negative hematology ROS (+)   Anesthesia Other Findings   Reproductive/Obstetrics negative OB ROS                             Anesthesia Physical Anesthesia Plan  ASA: II  Anesthesia Plan: General   Post-op Pain Management:    Induction: Intravenous  PONV Risk Score and Plan: 3 and Ondansetron, Dexamethasone, Treatment may vary due to age or medical condition and Midazolam  Airway Management Planned: Oral ETT  Additional Equipment:   Intra-op Plan:   Post-operative Plan: Extubation in OR  Informed Consent: I have reviewed the patients History and Physical, chart, labs and discussed the procedure including the risks, benefits and alternatives for the proposed anesthesia with the patient or authorized representative who has indicated his/her understanding and acceptance.     Dental advisory given  Plan Discussed with: CRNA and Surgeon  Anesthesia Plan Comments:         Anesthesia Quick Evaluation

## 2018-10-29 NOTE — Progress Notes (Signed)
Orthopedic Tech Progress Note Patient Details:  Judy Hernandez 1966-04-22 [id number]  Patient ID: [name], female   DOB: Nov 05, 1965, 53 y.o.   MRN: [id number]   Saul Fordyce 10/29/2018, 2:07 PMCalled [employer] for left Cam walker boot.

## 2018-10-29 NOTE — Transfer of Care (Signed)
Immediate Anesthesia Transfer of Care Note  Patient: Judy Hernandez  Procedure(s) Performed: REPAIR DISTAL TIBIA NONUNION WITH HARDWARE REMOVAL WITH WOUND VAC PLACEMENT (Right Leg Lower)  Patient Location: PACU  Anesthesia Type:General  Level of Consciousness: awake, alert  and oriented  Airway & Oxygen Therapy: Patient Spontanous Breathing and Patient connected to nasal cannula oxygen  Post-op Assessment: Report given to RN, Post -op Vital signs reviewed and stable and Patient moving all extremities  Post vital signs: Reviewed and stable  Last Vitals:  Vitals Value Taken Time  BP 110/72 10/29/2018 10:49 AM  Temp    Pulse 81 10/29/2018 10:51 AM  Resp 20 10/29/2018 10:51 AM  SpO2 97 % 10/29/2018 10:51 AM  Vitals shown include unvalidated device data.  Last Pain:  Vitals:   10/29/18 0620  TempSrc:   PainSc: 4       Patients Stated Pain Goal: 2 (10/29/18 8241)  Complications: No apparent anesthesia complications

## 2018-10-29 NOTE — Plan of Care (Signed)
  Problem: Clinical Measurements: Goal: Postoperative complications will be avoided or minimized Outcome: Progressing   Problem: Skin Integrity: Goal: Demonstration of wound healing without infection will improve Outcome: Progressing   Problem: Education: Goal: Knowledge of General Education information will improve Description Including pain rating scale, medication(s)/side effects and non-pharmacologic comfort measures Outcome: Progressing   Problem: Health Behavior/Discharge Planning: Goal: Ability to manage health-related needs will improve Outcome: Progressing   Problem: Clinical Measurements: Goal: Ability to maintain clinical measurements within normal limits will improve Outcome: Progressing Goal: Will remain free from infection Outcome: Progressing Goal: Diagnostic test results will improve Outcome: Progressing   Problem: Activity: Goal: Risk for activity intolerance will decrease Outcome: Progressing   Problem: Nutrition: Goal: Adequate nutrition will be maintained Outcome: Progressing   Problem: Coping: Goal: Level of anxiety will decrease Outcome: Progressing   Problem: Elimination: Goal: Will not experience complications related to bowel motility Outcome: Progressing Goal: Will not experience complications related to urinary retention Outcome: Progressing   Problem: Pain Managment: Goal: General experience of comfort will improve Outcome: Progressing   Problem: Safety: Goal: Ability to remain free from injury will improve Outcome: Progressing   Problem: Skin Integrity: Goal: Risk for impaired skin integrity will decrease Outcome: Progressing   
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
## 2018-10-29 NOTE — Interval H&P Note (Signed)
History and Physical Interval Note:  10/29/2018 6:30 AM  Judy Hernandez  has presented today for surgery, with the diagnosis of RIGHT TIBIA NONUNION WITH HARDWARE FAILURE.  The various methods of treatment have been discussed with the patient and family. After consideration of risks, benefits and other options for treatment, the patient has consented to  Procedure(s): REPAIR DISTAL TIBIA NONUNION WITH HARDWARE REMOVAL (Right) as a surgical intervention.  The patient's history has been reviewed, patient examined, no change in status, stable for surgery.  I have reviewed the patient's chart and labs.  Questions were answered to the patient's satisfaction.     Caryn Bee P Haddix
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
## 2018-10-29 NOTE — Progress Notes (Signed)
Arrived to 6n4 from PACU at this time. Very anxious and c/o back pain.

## 2018-10-29 NOTE — Anesthesia Procedure Notes (Addendum)
Anesthesia Regional Block: Adductor canal block   Pre-Anesthetic Checklist: ,, timeout performed, Correct Patient, Correct Site, Correct Laterality, Correct Procedure, Correct Position, site marked, Risks and benefits discussed,  Surgical consent,  Pre-op evaluation,  At surgeon's request and post-op pain management  Laterality: Right  Prep: chloraprep       Needles:  Injection technique: Single-shot  Needle Type: Echogenic Needle     Needle Length: 9cm      Additional Needles:   Procedures:,,,, ultrasound used (permanent image in chart),,,,  Narrative:  Start time: 10/29/2018 10:50 AM End time: 10/29/2018 11:00 AM Injection made incrementally with aspirations every 5 mL.  Performed by: Personally  Anesthesiologist: Eilene Ghazi, MD  Additional Notes: Patient tolerated the procedure well without complications

## 2018-10-30 ENCOUNTER — Encounter (HOSPITAL_COMMUNITY): Payer: Self-pay | Admitting: Student

## 2018-10-30 ENCOUNTER — Inpatient Hospital Stay (HOSPITAL_COMMUNITY): Payer: Medicaid - Out of State

## 2018-10-30 DIAGNOSIS — E559 Vitamin D deficiency, unspecified: Secondary | ICD-10-CM | POA: Diagnosis present

## 2018-10-30 DIAGNOSIS — S82401N Unspecified fracture of shaft of right fibula, subsequent encounter for open fracture type IIIA, IIIB, or IIIC with nonunion: Secondary | ICD-10-CM | POA: Diagnosis present

## 2018-10-30 DIAGNOSIS — E039 Hypothyroidism, unspecified: Secondary | ICD-10-CM | POA: Diagnosis present

## 2018-10-30 DIAGNOSIS — S32031D Stable burst fracture of third lumbar vertebra, subsequent encounter for fracture with routine healing: Secondary | ICD-10-CM | POA: Diagnosis not present

## 2018-10-30 DIAGNOSIS — S82201N Unspecified fracture of shaft of right tibia, subsequent encounter for open fracture type IIIA, IIIB, or IIIC with nonunion: Secondary | ICD-10-CM | POA: Diagnosis present

## 2018-10-30 DIAGNOSIS — S82831M Other fracture of upper and lower end of right fibula, subsequent encounter for open fracture type I or II with nonunion: Secondary | ICD-10-CM | POA: Diagnosis not present

## 2018-10-30 DIAGNOSIS — Z87891 Personal history of nicotine dependence: Secondary | ICD-10-CM | POA: Diagnosis not present

## 2018-10-30 DIAGNOSIS — S82871N Displaced pilon fracture of right tibia, subsequent encounter for open fracture type IIIA, IIIB, or IIIC with nonunion: Secondary | ICD-10-CM | POA: Diagnosis present

## 2018-10-30 DIAGNOSIS — D62 Acute posthemorrhagic anemia: Secondary | ICD-10-CM | POA: Diagnosis not present

## 2018-10-30 LAB — CALCIUM, IONIZED: Calcium, Ionized, Serum: 5.1 mg/dL (ref 4.5–5.6)

## 2018-10-30 LAB — PARATHYROID HORMONE, INTACT (NO CA): PTH: 23 pg/mL (ref 15–65)

## 2018-10-30 LAB — CBC
HCT: 31.2 % — ABNORMAL LOW (ref 36.0–46.0)
Hemoglobin: 10.1 g/dL — ABNORMAL LOW (ref 12.0–15.0)
MCH: 31.4 pg (ref 26.0–34.0)
MCHC: 32.4 g/dL (ref 30.0–36.0)
MCV: 96.9 fL (ref 80.0–100.0)
Platelets: 237 10*3/uL (ref 150–400)
RBC: 3.22 MIL/uL — ABNORMAL LOW (ref 3.87–5.11)
RDW: 14.3 % (ref 11.5–15.5)
WBC: 10.8 10*3/uL — ABNORMAL HIGH (ref 4.0–10.5)
nRBC: 0 % (ref 0.0–0.2)

## 2018-10-30 LAB — T4, FREE: Free T4: 0.9 ng/dL (ref 0.82–1.77)

## 2018-10-30 LAB — VITAMIN D 25 HYDROXY (VIT D DEFICIENCY, FRACTURES): Vit D, 25-Hydroxy: 21.5 ng/mL — ABNORMAL LOW (ref 30.0–100.0)

## 2018-10-30 MED ORDER — LEVOTHYROXINE SODIUM 25 MCG PO TABS
25.0000 ug | ORAL_TABLET | Freq: Every day | ORAL | Status: DC
  Administered 2018-10-30 – 2018-10-31 (×2): 25 ug via ORAL
  Filled 2018-10-30 (×2): qty 1

## 2018-10-30 MED ORDER — VITAMIN D 25 MCG (1000 UNIT) PO TABS
2000.0000 [IU] | ORAL_TABLET | Freq: Two times a day (BID) | ORAL | Status: DC
  Administered 2018-10-30 – 2018-10-31 (×3): 2000 [IU] via ORAL
  Filled 2018-10-30 (×3): qty 2

## 2018-10-30 MED ORDER — GABAPENTIN 400 MG PO CAPS
400.0000 mg | ORAL_CAPSULE | Freq: Three times a day (TID) | ORAL | Status: DC
  Administered 2018-10-30 – 2018-10-31 (×4): 400 mg via ORAL
  Filled 2018-10-30 (×5): qty 1

## 2018-10-30 MED ORDER — KETOROLAC TROMETHAMINE 15 MG/ML IJ SOLN
15.0000 mg | Freq: Four times a day (QID) | INTRAMUSCULAR | Status: AC
  Administered 2018-10-30 (×2): 15 mg via INTRAVENOUS
  Filled 2018-10-30 (×3): qty 1
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
## 2018-10-30 NOTE — Progress Notes (Signed)
PT Cancellation Note  Patient Details Name: Judy Hernandez MRN: [id number] DOB: [birth year]   Cancelled Treatment:    Reason Eval/Treat Not Completed: Pain limiting ability to participate. Will follow-up for PT treatment as schedule permits.  Ina Homes, PT, DPT Acute Rehabilitation Services  Pager [phone] Office [phone]  Malachy Chamber 10/30/2018, 10:48 AM
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
## 2018-10-30 NOTE — Progress Notes (Signed)
OT Cancellation Note  Patient Details Name: [name] MRN: [id number] DOB: [birth year]   Cancelled Treatment:    Reason Eval/Treat Not Completed: Pain limiting ability to participate. Pt reporting 9/10 pain and declining therapy this morning. Agreeable to therapist checking back in PM if time allows.  Cipriano Mile OTR/L 10/30/2018, 10:30 AM

## 2018-10-30 NOTE — Progress Notes (Signed)
Spoke with Dr. Newell Coral with [facility] this afternoon regarding this patient. Will get MRI and CT of lumbar spine while patient is in the hospital and Dr. Newell Coral would like to follow-up with patient in the outpatient setting in 1-2 weeks after discharge.   Judy Hernandez A. Ladonna Snide [facility] ?([phone]? (phone)

## 2018-10-31 LAB — T3: T3, Total: 79 ng/dL (ref 71–180)

## 2018-10-31 LAB — T3, FREE: T3, Free: 2.2 pg/mL (ref 2.0–4.4)

## 2018-10-31 MED ORDER — VITAMIN D 125 MCG (5000 UT) PO CAPS: 5000.0000 [IU] | ORAL_CAPSULE | Freq: Every day | ORAL | 1 refills | Status: AC

## 2018-10-31 MED ORDER — OXYCODONE-ACETAMINOPHEN 5-325 MG PO TABS: 1.0000 | ORAL_TABLET | Freq: Four times a day (QID) | ORAL | 0 refills | Status: DC | PRN

## 2018-10-31 MED ORDER — LEVOTHYROXINE SODIUM 25 MCG PO TABS: 25.0000 ug | ORAL_TABLET | Freq: Every day | ORAL | 0 refills | Status: AC
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
## 2018-10-31 NOTE — Progress Notes (Signed)
Discharged pt to home. Alert, oriented and stable. Instructions given and explained. Belongings returned. 

## 2018-10-31 NOTE — Plan of Care (Signed)
  Problem: Education: Goal: Required Educational Video(s) Outcome: Progressing   Problem: Clinical Measurements: Goal: Postoperative complications will be avoided or minimized Outcome: Progressing   Problem: Skin Integrity: Goal: Demonstration of wound healing without infection will improve Outcome: Progressing   Problem: Education: Goal: Knowledge of General Education information will improve Description Including pain rating scale, medication(s)/side effects and non-pharmacologic comfort measures Outcome: Progressing   Problem: Health Behavior/Discharge Planning: Goal: Ability to manage health-related needs will improve Outcome: Progressing   Problem: Clinical Measurements: Goal: Ability to maintain clinical measurements within normal limits will improve Outcome: Progressing Goal: Will remain free from infection Outcome: Progressing Goal: Diagnostic test results will improve Outcome: Progressing   Problem: Activity: Goal: Risk for activity intolerance will decrease Outcome: Progressing   Problem: Nutrition: Goal: Adequate nutrition will be maintained Outcome: Progressing   Problem: Coping: Goal: Level of anxiety will decrease Outcome: Progressing   Problem: Elimination: Goal: Will not experience complications related to bowel motility Outcome: Progressing Goal: Will not experience complications related to urinary retention Outcome: Progressing

## 2018-10-31 NOTE — Progress Notes (Signed)
Orthopedic Tech Progress Note Patient Details:  Judy Hernandez 1965/09/15 [id number] Called in order to [facility] for CAM WALKING BOOT for the LRE Patient ID: Judy Hernandez, female   DOB: 1966/03/13, 53 y.o.   MRN: [id number]   Judy Hernandez 10/31/2018, 8:05 AM
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
## 2018-11-01 LAB — THYROID PEROXIDASE ANTIBODY: Thyroperoxidase Ab SerPl-aCnc: 11 IU/mL (ref 0–34)

## 2019-03-03 ENCOUNTER — Ambulatory Visit: Payer: Self-pay | Admitting: Student

## 2019-03-03 DIAGNOSIS — T8484XA Pain due to internal orthopedic prosthetic devices, implants and grafts, initial encounter: Secondary | ICD-10-CM

## 2019-03-06 ENCOUNTER — Other Ambulatory Visit (HOSPITAL_COMMUNITY)
Admission: RE | Admit: 2019-03-06 | Discharge: 2019-03-06 | Disposition: A | Discharge status: Home / Self Care | Payer: Medicaid - Out of State | Source: Ambulatory Visit | Attending: Student | Admitting: Student

## 2019-03-06 ENCOUNTER — Other Ambulatory Visit: Payer: Self-pay

## 2019-03-06 ENCOUNTER — Encounter (HOSPITAL_COMMUNITY): Payer: Self-pay | Admitting: *Deleted

## 2019-03-06 DIAGNOSIS — Z20828 Contact with and (suspected) exposure to other viral communicable diseases: Secondary | ICD-10-CM | POA: Insufficient documentation

## 2019-03-06 DIAGNOSIS — Z01812 Encounter for preprocedural laboratory examination: Secondary | ICD-10-CM | POA: Insufficient documentation

## 2019-03-06 LAB — SARS CORONAVIRUS 2 (TAT 6-24 HRS): SARS Coronavirus 2: NEGATIVE

## 2019-03-06 NOTE — Progress Notes (Signed)
Pt denies SOB, chest pain, and being under the care of a cardiologist. Pt denies having a stress test, echo and cardiac cath. Pt denies recent labs. Pt made aware to stop taking  Aspirin (unless otherwise advised by surgeon), vitamins, fish oil and herbal medications. Do not take any NSAIDs ie: Ibuprofen, Advil, Naproxen (Aleve), Motrin, BC and Goody Powder. Pt verbalized understanding of all pre-op instructions.

## 2019-03-09 ENCOUNTER — Encounter (HOSPITAL_COMMUNITY): Payer: Self-pay | Admitting: General Practice

## 2019-03-09 ENCOUNTER — Ambulatory Visit (HOSPITAL_COMMUNITY): Payer: Medicaid - Out of State

## 2019-03-09 ENCOUNTER — Ambulatory Visit (HOSPITAL_COMMUNITY)
Admission: RE | Admit: 2019-03-09 | Discharge: 2019-03-09 | Disposition: A | Discharge status: Home / Self Care | Payer: Medicaid - Out of State | Attending: Student | Admitting: Student

## 2019-03-09 ENCOUNTER — Ambulatory Visit (HOSPITAL_COMMUNITY): Payer: Medicaid - Out of State | Admitting: Anesthesiology

## 2019-03-09 ENCOUNTER — Encounter (HOSPITAL_COMMUNITY)
Admission: RE | Disposition: A | Discharge status: Home / Self Care | Payer: Self-pay | Source: Home / Self Care | Attending: Student

## 2019-03-09 ENCOUNTER — Other Ambulatory Visit: Payer: Self-pay

## 2019-03-09 DIAGNOSIS — T8484XD Pain due to internal orthopedic prosthetic devices, implants and grafts, subsequent encounter: Secondary | ICD-10-CM | POA: Diagnosis not present

## 2019-03-09 DIAGNOSIS — Z87891 Personal history of nicotine dependence: Secondary | ICD-10-CM | POA: Insufficient documentation

## 2019-03-09 DIAGNOSIS — M47896 Other spondylosis, lumbar region: Secondary | ICD-10-CM | POA: Diagnosis not present

## 2019-03-09 DIAGNOSIS — T84038A Mechanical loosening of other internal prosthetic joint, initial encounter: Secondary | ICD-10-CM | POA: Insufficient documentation

## 2019-03-09 DIAGNOSIS — Z79899 Other long term (current) drug therapy: Secondary | ICD-10-CM | POA: Insufficient documentation

## 2019-03-09 DIAGNOSIS — Z7982 Long term (current) use of aspirin: Secondary | ICD-10-CM | POA: Diagnosis not present

## 2019-03-09 DIAGNOSIS — F419 Anxiety disorder, unspecified: Secondary | ICD-10-CM | POA: Insufficient documentation

## 2019-03-09 DIAGNOSIS — Y793 Surgical instruments, materials and orthopedic devices (including sutures) associated with adverse incidents: Secondary | ICD-10-CM | POA: Insufficient documentation

## 2019-03-09 DIAGNOSIS — Z419 Encounter for procedure for purposes other than remedying health state, unspecified: Secondary | ICD-10-CM

## 2019-03-09 DIAGNOSIS — Z9119 Patient's noncompliance with other medical treatment and regimen: Secondary | ICD-10-CM | POA: Diagnosis not present

## 2019-03-09 DIAGNOSIS — T8484XA Pain due to internal orthopedic prosthetic devices, implants and grafts, initial encounter: Secondary | ICD-10-CM

## 2019-03-09 DIAGNOSIS — Z79890 Hormone replacement therapy (postmenopausal): Secondary | ICD-10-CM | POA: Insufficient documentation

## 2019-03-09 DIAGNOSIS — F329 Major depressive disorder, single episode, unspecified: Secondary | ICD-10-CM | POA: Diagnosis not present

## 2019-03-09 DIAGNOSIS — E039 Hypothyroidism, unspecified: Secondary | ICD-10-CM | POA: Insufficient documentation

## 2019-03-09 HISTORY — DX: Hypothyroidism, unspecified: E03.9

## 2019-03-09 HISTORY — PX: HARDWARE REMOVAL: SHX979

## 2019-03-09 HISTORY — DX: Presence of dental prosthetic device (complete) (partial): Z97.2

## 2019-03-09 HISTORY — DX: Prediabetes: R73.03

## 2019-03-09 HISTORY — DX: Other fracture of right lower leg, initial encounter for closed fracture: S82.891A

## 2019-03-09 LAB — BASIC METABOLIC PANEL
Anion gap: 8 (ref 5–15)
BUN: 14 mg/dL (ref 6–20)
CO2: 25 mmol/L (ref 22–32)
Calcium: 9.4 mg/dL (ref 8.9–10.3)
Chloride: 102 mmol/L (ref 98–111)
Creatinine, Ser: 0.66 mg/dL (ref 0.44–1.00)
GFR calc Af Amer: >60 mL/min (ref >60)
GFR calc non Af Amer: >60 mL/min (ref >60)
Glucose, Bld: 117 mg/dL — ABNORMAL HIGH (ref 70–99)
Potassium: 4.2 mmol/L (ref 3.5–5.1)
Sodium: 135 mmol/L (ref 135–145)

## 2019-03-09 LAB — SURGICAL PCR SCREEN
MRSA, PCR: NEGATIVE
Staphylococcus aureus: NEGATIVE

## 2019-03-09 LAB — CBC
HCT: 42.2 % (ref 36.0–46.0)
Hemoglobin: 13.7 g/dL (ref 12.0–15.0)
MCH: 32 pg (ref 26.0–34.0)
MCHC: 32.5 g/dL (ref 30.0–36.0)
MCV: 98.6 fL (ref 80.0–100.0)
Platelets: 254 10*3/uL (ref 150–400)
RBC: 4.28 MIL/uL (ref 3.87–5.11)
RDW: 14.3 % (ref 11.5–15.5)
WBC: 6.8 10*3/uL (ref 4.0–10.5)
nRBC: 0 % (ref 0.0–0.2)

## 2019-03-09 SURGERY — REMOVAL, HARDWARE
Anesthesia: General | Site: Ankle | Laterality: Right

## 2019-03-09 MED ORDER — VANCOMYCIN HCL 1000 MG IV SOLR
INTRAVENOUS | Status: AC
  Filled 2019-03-09: qty 1000

## 2019-03-09 MED ORDER — CEFAZOLIN SODIUM-DEXTROSE 2-4 GM/100ML-% IV SOLN
2.0000 g | INTRAVENOUS | Status: AC
  Administered 2019-03-09: 2 g via INTRAVENOUS

## 2019-03-09 MED ORDER — 0.9 % SODIUM CHLORIDE (POUR BTL) OPTIME
TOPICAL | Status: DC | PRN
  Administered 2019-03-09: 1000 mL

## 2019-03-09 MED ORDER — EPHEDRINE 5 MG/ML INJ
INTRAVENOUS | Status: AC
  Filled 2019-03-09: qty 10

## 2019-03-09 MED ORDER — ONDANSETRON HCL 4 MG/2ML IJ SOLN
INTRAMUSCULAR | Status: AC
  Filled 2019-03-09: qty 2

## 2019-03-09 MED ORDER — DEXAMETHASONE SODIUM PHOSPHATE 10 MG/ML IJ SOLN
INTRAMUSCULAR | Status: DC | PRN
  Administered 2019-03-09: 10 mg via INTRAVENOUS

## 2019-03-09 MED ORDER — MIDAZOLAM HCL 5 MG/5ML IJ SOLN
INTRAMUSCULAR | Status: DC | PRN
  Administered 2019-03-09: 2 mg via INTRAVENOUS

## 2019-03-09 MED ORDER — MIDAZOLAM HCL 2 MG/2ML IJ SOLN
INTRAMUSCULAR | Status: AC
  Filled 2019-03-09: qty 2

## 2019-03-09 MED ORDER — OXYCODONE HCL 5 MG/5ML PO SOLN: 5.0000 mg | Freq: Once | ORAL | Status: DC | PRN

## 2019-03-09 MED ORDER — FENTANYL CITRATE (PF) 100 MCG/2ML IJ SOLN
INTRAMUSCULAR | Status: DC | PRN
  Administered 2019-03-09 (×2): 50 ug via INTRAVENOUS
  Administered 2019-03-09: 100 ug via INTRAVENOUS
  Administered 2019-03-09: 50 ug via INTRAVENOUS

## 2019-03-09 MED ORDER — LIDOCAINE 2% (20 MG/ML) 5 ML SYRINGE
INTRAMUSCULAR | Status: AC
  Filled 2019-03-09: qty 5

## 2019-03-09 MED ORDER — MEPERIDINE HCL 25 MG/ML IJ SOLN: 6.2500 mg | INTRAMUSCULAR | Status: DC | PRN

## 2019-03-09 MED ORDER — PROMETHAZINE HCL 25 MG/ML IJ SOLN: 6.2500 mg | INTRAMUSCULAR | Status: DC | PRN

## 2019-03-09 MED ORDER — PROPOFOL 10 MG/ML IV BOLUS
INTRAVENOUS | Status: DC | PRN
  Administered 2019-03-09: 200 mg via INTRAVENOUS

## 2019-03-09 MED ORDER — HYDROMORPHONE HCL 1 MG/ML IJ SOLN
INTRAMUSCULAR | Status: AC
  Filled 2019-03-09: qty 1

## 2019-03-09 MED ORDER — FENTANYL CITRATE (PF) 250 MCG/5ML IJ SOLN
INTRAMUSCULAR | Status: AC
  Filled 2019-03-09: qty 5

## 2019-03-09 MED ORDER — OXYCODONE-ACETAMINOPHEN 5-325 MG PO TABS: 1.0000 | ORAL_TABLET | Freq: Four times a day (QID) | ORAL | 0 refills | Status: AC | PRN

## 2019-03-09 MED ORDER — ONDANSETRON HCL 4 MG/2ML IJ SOLN
INTRAMUSCULAR | Status: DC | PRN
  Administered 2019-03-09: 4 mg via INTRAVENOUS

## 2019-03-09 MED ORDER — HYDROMORPHONE HCL 1 MG/ML IJ SOLN
0.2500 mg | INTRAMUSCULAR | Status: DC | PRN
  Administered 2019-03-09 (×2): 0.5 mg via INTRAVENOUS

## 2019-03-09 MED ORDER — DEXMEDETOMIDINE HCL 200 MCG/2ML IV SOLN
INTRAVENOUS | Status: DC | PRN
  Administered 2019-03-09 (×3): 20 ug via INTRAVENOUS

## 2019-03-09 MED ORDER — DEXAMETHASONE SODIUM PHOSPHATE 10 MG/ML IJ SOLN
INTRAMUSCULAR | Status: AC
  Filled 2019-03-09: qty 1

## 2019-03-09 MED ORDER — LIDOCAINE HCL (CARDIAC) PF 100 MG/5ML IV SOSY
PREFILLED_SYRINGE | INTRAVENOUS | Status: DC | PRN
  Administered 2019-03-09: 80 mg via INTRAVENOUS

## 2019-03-09 MED ORDER — CHLORHEXIDINE GLUCONATE 4 % EX LIQD: 60.0000 mL | Freq: Once | CUTANEOUS | Status: DC

## 2019-03-09 MED ORDER — POVIDONE-IODINE 10 % EX SWAB: 2.0000 "application " | Freq: Once | CUTANEOUS | Status: DC

## 2019-03-09 MED ORDER — CEFAZOLIN SODIUM-DEXTROSE 2-4 GM/100ML-% IV SOLN
INTRAVENOUS | Status: AC
  Filled 2019-03-09: qty 100

## 2019-03-09 MED ORDER — EPHEDRINE SULFATE-NACL 50-0.9 MG/10ML-% IV SOSY
PREFILLED_SYRINGE | INTRAVENOUS | Status: DC | PRN
  Administered 2019-03-09: 10 mg via INTRAVENOUS
  Administered 2019-03-09: 5 mg via INTRAVENOUS
  Administered 2019-03-09: 10 mg via INTRAVENOUS

## 2019-03-09 MED ORDER — LACTATED RINGERS IV SOLN
INTRAVENOUS | Status: DC | PRN
  Administered 2019-03-09: 07:00:00 via INTRAVENOUS

## 2019-03-09 MED ORDER — PROPOFOL 10 MG/ML IV BOLUS
INTRAVENOUS | Status: AC
  Filled 2019-03-09: qty 20

## 2019-03-09 MED ORDER — OXYCODONE HCL 5 MG PO TABS: 5.0000 mg | ORAL_TABLET | Freq: Once | ORAL | Status: DC | PRN

## 2019-03-09 SURGICAL SUPPLY — 62 items
BANDAGE ESMARK 6X9 LF (GAUZE/BANDAGES/DRESSINGS) ×1 IMPLANT
BIT DRILL 2.5X110 QC LCP DISP (BIT) ×1 IMPLANT
BNDG COHESIVE 6X5 TAN STRL LF (GAUZE/BANDAGES/DRESSINGS) ×2 IMPLANT
BNDG ELASTIC 3X5.8 VLCR STR LF (GAUZE/BANDAGES/DRESSINGS) ×1 IMPLANT
BNDG ELASTIC 4X5.8 VLCR STR LF (GAUZE/BANDAGES/DRESSINGS) ×2 IMPLANT
BNDG ELASTIC 6X5.8 VLCR STR LF (GAUZE/BANDAGES/DRESSINGS) ×2 IMPLANT
BNDG ESMARK 6X9 LF (GAUZE/BANDAGES/DRESSINGS) ×2
BNDG GAUZE ELAST 4 BULKY (GAUZE/BANDAGES/DRESSINGS) ×4 IMPLANT
BRUSH SCRUB EZ PLAIN DRY (MISCELLANEOUS) ×4 IMPLANT
CHLORAPREP W/TINT 26 (MISCELLANEOUS) ×2 IMPLANT
COVER SURGICAL LIGHT HANDLE (MISCELLANEOUS) ×4 IMPLANT
COVER WAND RF STERILE (DRAPES) ×2 IMPLANT
CUFF TOURN SGL QUICK 18X4 (TOURNIQUET CUFF) IMPLANT
CUFF TOURN SGL QUICK 24 (TOURNIQUET CUFF)
CUFF TOURN SGL QUICK 34 (TOURNIQUET CUFF)
CUFF TRNQT CYL 24X4X16.5-23 (TOURNIQUET CUFF) IMPLANT
CUFF TRNQT CYL 34X4.125X (TOURNIQUET CUFF) IMPLANT
DRAPE C-ARM 42X72 X-RAY (DRAPES) IMPLANT
DRAPE C-ARMOR (DRAPES) ×2 IMPLANT
DRAPE U-SHAPE 47X51 STRL (DRAPES) ×2 IMPLANT
DRSG ADAPTIC 3X8 NADH LF (GAUZE/BANDAGES/DRESSINGS) ×2 IMPLANT
ELECT REM PT RETURN 9FT ADLT (ELECTROSURGICAL) ×2
ELECTRODE REM PT RTRN 9FT ADLT (ELECTROSURGICAL) ×1 IMPLANT
GAUZE SPONGE 4X4 12PLY STRL (GAUZE/BANDAGES/DRESSINGS) ×2 IMPLANT
GLOVE BIO SURGEON STRL SZ 6.5 (GLOVE) ×6 IMPLANT
GLOVE BIO SURGEON STRL SZ7.5 (GLOVE) ×8 IMPLANT
GLOVE BIOGEL PI IND STRL 6.5 (GLOVE) ×1 IMPLANT
GLOVE BIOGEL PI IND STRL 7.5 (GLOVE) ×1 IMPLANT
GLOVE BIOGEL PI INDICATOR 6.5 (GLOVE) ×1
GLOVE BIOGEL PI INDICATOR 7.5 (GLOVE) ×1
GOWN STRL REUS W/ TWL LRG LVL3 (GOWN DISPOSABLE) ×2 IMPLANT
GOWN STRL REUS W/TWL LRG LVL3 (GOWN DISPOSABLE) ×2
KIT BASIN OR (CUSTOM PROCEDURE TRAY) ×2 IMPLANT
KIT TURNOVER KIT B (KITS) ×2 IMPLANT
MANIFOLD NEPTUNE II (INSTRUMENTS) ×2 IMPLANT
NEEDLE 22X1 1/2 (OR ONLY) (NEEDLE) IMPLANT
NS IRRIG 1000ML POUR BTL (IV SOLUTION) ×2 IMPLANT
PACK ORTHO EXTREMITY (CUSTOM PROCEDURE TRAY) ×2 IMPLANT
PAD ARMBOARD 7.5X6 YLW CONV (MISCELLANEOUS) ×4 IMPLANT
PADDING CAST COTTON 6X4 STRL (CAST SUPPLIES) ×6 IMPLANT
SCREW CORTEX LOW PRO 3.5X28 (Screw) ×1 IMPLANT
SPONGE LAP 18X18 RF (DISPOSABLE) ×2 IMPLANT
STAPLER VISISTAT 35W (STAPLE) IMPLANT
STOCKINETTE IMPERVIOUS LG (DRAPES) ×2 IMPLANT
STRIP CLOSURE SKIN 1/2X4 (GAUZE/BANDAGES/DRESSINGS) IMPLANT
SUCTION FRAZIER HANDLE 10FR (MISCELLANEOUS)
SUCTION TUBE FRAZIER 10FR DISP (MISCELLANEOUS) IMPLANT
SUT ETHILON 3 0 PS 1 (SUTURE) IMPLANT
SUT MNCRL AB 3-0 PS2 18 (SUTURE) ×2 IMPLANT
SUT MON AB 2-0 CT1 36 (SUTURE) ×2 IMPLANT
SUT PDS AB 2-0 CT1 27 (SUTURE) IMPLANT
SUT VIC AB 0 CT1 27 (SUTURE)
SUT VIC AB 0 CT1 27XBRD ANBCTR (SUTURE) IMPLANT
SUT VIC AB 2-0 CT1 27 (SUTURE)
SUT VIC AB 2-0 CT1 TAPERPNT 27 (SUTURE) IMPLANT
SYR CONTROL 10ML LL (SYRINGE) IMPLANT
TOWEL GREEN STERILE (TOWEL DISPOSABLE) ×4 IMPLANT
TOWEL GREEN STERILE FF (TOWEL DISPOSABLE) ×4 IMPLANT
TUBE CONNECTING 12X1/4 (SUCTIONS) ×2 IMPLANT
UNDERPAD 30X30 (UNDERPADS AND DIAPERS) ×2 IMPLANT
WATER STERILE IRR 1000ML POUR (IV SOLUTION) ×4 IMPLANT
YANKAUER SUCT BULB TIP NO VENT (SUCTIONS) ×2 IMPLANT

## 2019-03-09 NOTE — Anesthesia Preprocedure Evaluation (Addendum)
Anesthesia Evaluation  Patient identified by MRN, date of birth, ID band Patient awake    Reviewed: Allergy & Precautions, NPO status , Patient's Chart, lab work & pertinent test results  Airway Mallampati: II  TM Distance: >3 FB Neck ROM: Full    Dental no notable dental hx. (+) Edentulous Upper, Edentulous Lower, Dental Advisory Given   Pulmonary neg pulmonary ROS, former smoker,    Pulmonary exam normal breath sounds clear to auscultation       Cardiovascular negative cardio ROS Normal cardiovascular exam Rhythm:Regular Rate:Normal     Neuro/Psych  Headaches, Anxiety Depression    GI/Hepatic negative GI ROS, Neg liver ROS,   Endo/Other  Hypothyroidism   Renal/GU negative Renal ROS  negative genitourinary   Musculoskeletal  (+) Arthritis , Osteoarthritis,    Abdominal   Peds negative pediatric ROS (+)  Hematology negative hematology ROS (+)   Anesthesia Other Findings   Reproductive/Obstetrics negative OB ROS                            Anesthesia Physical  Anesthesia Plan  ASA: II  Anesthesia Plan: General   Post-op Pain Management:    Induction: Intravenous  PONV Risk Score and Plan: 3 and Ondansetron, Dexamethasone, Treatment may vary due to age or medical condition and Midazolam  Airway Management Planned: LMA  Additional Equipment:   Intra-op Plan:   Post-operative Plan: Extubation in OR  Informed Consent: I have reviewed the patients History and Physical, chart, labs and discussed the procedure including the risks, benefits and alternatives for the proposed anesthesia with the patient or authorized representative who has indicated his/her understanding and acceptance.     Dental advisory given  Plan Discussed with: CRNA and Surgeon  Anesthesia Plan Comments:         Anesthesia Quick Evaluation

## 2019-03-09 NOTE — Progress Notes (Signed)
Orthopedic Tech Progress Note Patient Details:  Judy Hernandez April 14, 1966 [id number] PACU RN called requesting post op shoe for patient Ortho Devices Type of Ortho Device: Postop shoe/boot Ortho Device/Splint Location: LRE Ortho Device/Splint Interventions: Adjustment, Application, Ordered   Post Interventions Patient Tolerated: Well Instructions Provided: Care of device, Adjustment of device   [name] 03/09/2019, 9:31 AM

## 2019-03-09 NOTE — Transfer of Care (Signed)
Immediate Anesthesia Transfer of Care Note  Patient: Judy Hernandez  Procedure(s) Performed: HARDWARE REMOVAL RIGHT ANKLE, EXCHANGE OF SCREWS (Right Ankle)  Patient Location: PACU  Anesthesia Type:General  Level of Consciousness: awake, oriented and patient cooperative  Airway & Oxygen Therapy: Patient Spontanous Breathing and Patient connected to nasal cannula oxygen  Post-op Assessment: Report given to RN and Post -op Vital signs reviewed and stable  Post vital signs: Reviewed  Last Vitals:  Vitals Value Taken Time  BP 157/77 03/09/19 0822  Temp    Pulse 48 03/09/19 0825  Resp 15 03/09/19 0825  SpO2 90 % 03/09/19 0825  Vitals shown include unvalidated device data.  Last Pain:  Vitals:   03/09/19 0637  TempSrc:   PainSc: 0-No pain      Patients Stated Pain Goal: 2 (16/60/60 0459)  Complications: No apparent anesthesia complications
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
## 2019-03-09 NOTE — Anesthesia Postprocedure Evaluation (Signed)
Anesthesia Post Note  Patient: Judy Hernandez  Procedure(s) Performed: HARDWARE REMOVAL RIGHT ANKLE, EXCHANGE OF SCREWS (Right Ankle)     Patient location during evaluation: PACU Anesthesia Type: General Level of consciousness: awake and alert Pain management: pain level controlled Vital Signs Assessment: post-procedure vital signs reviewed and stable Respiratory status: spontaneous breathing, nonlabored ventilation and respiratory function stable Cardiovascular status: blood pressure returned to baseline and stable Postop Assessment: no apparent nausea or vomiting Anesthetic complications: no    Last Vitals:  Vitals:   03/09/19 0852 03/09/19 0900  BP:    Pulse: (!) 57 64  Resp: 14 20  Temp:    SpO2: 97% 98%    Last Pain:  Vitals:   03/09/19 0822  TempSrc:   PainSc: 10-Worst pain ever                 Lynda Rainwater
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
## 2019-03-09 NOTE — Anesthesia Procedure Notes (Signed)
Procedure Name: LMA Insertion Date/Time: 03/09/2019 7:38 AM Performed by: Jenne Campus, CRNA Pre-anesthesia Checklist: Patient identified, Emergency Drugs available, Suction available and Patient being monitored Patient Re-evaluated:Patient Re-evaluated prior to induction Oxygen Delivery Method: Circle System Utilized Preoxygenation: Pre-oxygenation with 100% oxygen Induction Type: IV induction Ventilation: Mask ventilation without difficulty LMA: LMA inserted LMA Size: 4.0 Number of attempts: 1 Airway Equipment and Method: Bite block Placement Confirmation: positive ETCO2 and breath sounds checked- equal and bilateral Tube secured with: Tape Dental Injury: Teeth and Oropharynx as per pre-operative assessment

## 2019-03-10 ENCOUNTER — Encounter (HOSPITAL_COMMUNITY): Payer: Self-pay | Admitting: Student
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
